# Patient Record
Sex: Female | Born: 1980 | Race: Black or African American | Hispanic: No | Marital: Single | Smoking: Former smoker
Health system: Southern US, Community
[De-identification: ages and names within clinical notes are randomized; demographics above are authoritative.]

## PROBLEM LIST (undated history)

## (undated) ENCOUNTER — Inpatient Hospital Stay (HOSPITAL_COMMUNITY): Payer: Self-pay

## (undated) DIAGNOSIS — F419 Anxiety disorder, unspecified: Secondary | ICD-10-CM

## (undated) DIAGNOSIS — F329 Major depressive disorder, single episode, unspecified: Secondary | ICD-10-CM

## (undated) DIAGNOSIS — G43909 Migraine, unspecified, not intractable, without status migrainosus: Secondary | ICD-10-CM

## (undated) DIAGNOSIS — F32A Depression, unspecified: Secondary | ICD-10-CM

## (undated) DIAGNOSIS — K219 Gastro-esophageal reflux disease without esophagitis: Secondary | ICD-10-CM

---

## 2005-12-07 ENCOUNTER — Emergency Department (HOSPITAL_COMMUNITY): Admission: EM | Admit: 2005-12-07 | Discharge: 2005-12-07 | Payer: Self-pay | Admitting: Family Medicine

## 2006-06-26 ENCOUNTER — Emergency Department (HOSPITAL_COMMUNITY): Admission: EM | Admit: 2006-06-26 | Discharge: 2006-06-26 | Payer: Self-pay | Admitting: Family Medicine

## 2006-07-05 ENCOUNTER — Other Ambulatory Visit: Admission: RE | Admit: 2006-07-05 | Discharge: 2006-07-05 | Payer: Self-pay | Admitting: Obstetrics and Gynecology

## 2006-10-05 ENCOUNTER — Ambulatory Visit (HOSPITAL_COMMUNITY): Admission: RE | Admit: 2006-10-05 | Discharge: 2006-10-05 | Payer: Self-pay | Admitting: Obstetrics and Gynecology

## 2007-02-06 ENCOUNTER — Inpatient Hospital Stay (HOSPITAL_COMMUNITY): Admission: AD | Admit: 2007-02-06 | Discharge: 2007-02-09 | Payer: Self-pay | Admitting: Obstetrics and Gynecology

## 2007-02-07 ENCOUNTER — Encounter (INDEPENDENT_AMBULATORY_CARE_PROVIDER_SITE_OTHER): Payer: Self-pay | Admitting: Obstetrics and Gynecology

## 2007-03-21 ENCOUNTER — Other Ambulatory Visit: Admission: RE | Admit: 2007-03-21 | Discharge: 2007-03-21 | Payer: Self-pay | Admitting: Obstetrics and Gynecology

## 2007-06-30 ENCOUNTER — Emergency Department (HOSPITAL_COMMUNITY): Admission: EM | Admit: 2007-06-30 | Discharge: 2007-06-30 | Payer: Self-pay | Admitting: Family Medicine

## 2008-06-02 ENCOUNTER — Emergency Department (HOSPITAL_COMMUNITY): Admission: EM | Admit: 2008-06-02 | Discharge: 2008-06-02 | Payer: Self-pay | Admitting: Emergency Medicine

## 2010-11-04 NOTE — Discharge Summary (Signed)
[name] [name], [name] [name] NO.:  [id number]   MEDICAL RECORD NO.:  [id number]          PATIENT TYPE:  INP   LOCATION:  9111                          FACILITY:  [facility]   PHYSICIAN:  Rudy Jew. Ashley Royalty, M.D.DATE OF BIRTH:  06/13/1981   DATE OF ADMISSION:  02/06/2007  DATE OF DISCHARGE:  02/09/2007                               DISCHARGE SUMMARY   DISCHARGE DIAGNOSES:  1. Intrauterine pregnancy at 36 weeks 5 days, delivered.  2. Premature rupture of membranes.  3. Preterm birth of living child, vertex.   OPERATIONS AND SPECIAL PROCEDURES:  Obstetrical delivery.   CONSULTATIONS:  None.   DISCHARGE MEDICATIONS:  1. Percocet.  2. Motrin 60 mg.   HISTORY AND PHYSICAL:  This is a 30 year old gravida 2, para 0, AB1, at  36 weeks 5 days' gestation at the time of admission.  Prenatal care was  complicated by sickle trait.  The patient also had a positive RPR, but  subsequent TPPA was nonreactive.  She presented to maternity admissions  complaining of vaginal burning.  Subsequent examination revealed rupture  of membranes; the duration was unknown.  Examination per R.N. revealed  positive pooling and burning.  The patient was felt to be a loose 1 cm,  75%, -2 station, vertex presentation.  For the remainder of the history  and physical, please see chart.   HOSPITAL COURSE:  The patient was admitted to [facility]  [place].  Admission laboratory studies were drawn.  On August 21 at 9  a.m., the patient was noted be 3+ cm dilated, 100% effaced, minus 2 to -  3 station, vertex presentation.  There was a forebag noted which was  successfully rupture revealing clear fluid.  An intrauterine pressure  catheter was placed.  The patient was given Pitocin as well as  prophylactic antibiotics due to the indeterminate length of ruptured  membranes.  She went on to labor and deliver on February 07, 2007.  The  infant was a 6-pound 6-ounce female, Apgars of 4 at 1 minute, 8 at 5  minutes, and sent to newborn nursery.  Delivery was accomplished from +3  station, OA, with Kiwi vacuum extractor secondary to decelerations  during the late second stage.  Pediatrics team attended.  Arterial cord  pH was 7.33.  Delivery was accomplished over an intact perineum.  There  was a second-degree midline laceration which was repaired without  difficulty.  The patient did well after delivery was discharged home,  afebrile and in satisfactory condition, on the second postpartum day.   DISPOSITION:  The patient is return to [facility] in 4-6 weeks for postpartum evaluation.      James A. Ashley Royalty, M.D.  Electronically Signed     JAM/MEDQ  D:  03/06/2007  T:  03/07/2007  Job:  [id number]

## 2011-02-27 ENCOUNTER — Inpatient Hospital Stay (INDEPENDENT_AMBULATORY_CARE_PROVIDER_SITE_OTHER)
Admission: RE | Admit: 2011-02-27 | Discharge: 2011-02-27 | Disposition: A | Discharge status: Home / Self Care | Payer: BC Managed Care – PPO | Source: Ambulatory Visit | Attending: Family Medicine | Admitting: Family Medicine

## 2011-02-27 DIAGNOSIS — N61 Mastitis without abscess: Secondary | ICD-10-CM

## 2011-02-27 DIAGNOSIS — L42 Pityriasis rosea: Secondary | ICD-10-CM

## 2011-03-31 LAB — RPR: RPR Ser Ql: NONREACTIVE

## 2011-03-31 LAB — CBC
HCT: 30.4 — ABNORMAL LOW
Hemoglobin: 10.3 — ABNORMAL LOW
MCHC: 33.8
MCHC: 34
MCV: 80.4
MCV: 80.4
RBC: 4.43
RDW: 14.5 — ABNORMAL HIGH

## 2011-03-31 LAB — URINALYSIS, ROUTINE W REFLEX MICROSCOPIC
Ketones, ur: NEGATIVE
Nitrite: NEGATIVE
Specific Gravity, Urine: 1.015
pH: 6

## 2011-03-31 LAB — TYPE AND SCREEN: ABO/RH(D): O POS

## 2012-03-19 ENCOUNTER — Encounter (HOSPITAL_COMMUNITY): Payer: Self-pay | Admitting: *Deleted

## 2012-03-19 ENCOUNTER — Emergency Department (INDEPENDENT_AMBULATORY_CARE_PROVIDER_SITE_OTHER): Payer: BC Managed Care – PPO

## 2012-03-19 ENCOUNTER — Emergency Department (INDEPENDENT_AMBULATORY_CARE_PROVIDER_SITE_OTHER)
Admission: EM | Admit: 2012-03-19 | Discharge: 2012-03-19 | Disposition: A | Discharge status: Home / Self Care | Payer: BC Managed Care – PPO | Source: Home / Self Care | Attending: Emergency Medicine | Admitting: Emergency Medicine

## 2012-03-19 DIAGNOSIS — L089 Local infection of the skin and subcutaneous tissue, unspecified: Secondary | ICD-10-CM

## 2012-03-19 DIAGNOSIS — B079 Viral wart, unspecified: Secondary | ICD-10-CM

## 2012-03-19 DIAGNOSIS — B078 Other viral warts: Secondary | ICD-10-CM

## 2012-03-19 MED ORDER — AMOXICILLIN-POT CLAVULANATE 500-125 MG PO TABS: 1.0000 | ORAL_TABLET | Freq: Three times a day (TID) | ORAL | Status: DC

## 2012-03-19 NOTE — ED Provider Notes (Signed)
History     CSN: [id number]  Arrival date & time 03/19/12  1005   First MD Initiated Contact with Patient 03/19/12 1048      Chief Complaint  Patient presents with  . Finger Injury    (Consider location/radiation/quality/duration/timing/severity/associated sxs/prior treatment) HPI Comments: Pt reports right thumb pain for the past 3 months. Pt has tried peroxide,alcholol, cutting, freezing, and all the over-the-counter products as she initially noticed a wart looking like lesion on the palmar surface of her right thumb about the middle of her thumb. Pain has gotten progressively worse in the thumb is tender at touch on the palmar surface of her right thumb. 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 She's even having some difficulties when she moves her thumb. Patient denies any numbness, tingling or weakness distally 2 thumb. Denies having observed any active drainage. Patient denies any direct injury or trauma to her thumb.  Patient is a 31 y.o. female presenting with hand pain. The history is provided by the patient.  Hand Pain This is a recurrent problem. The problem occurs constantly. Pertinent negatives include no abdominal pain. The symptoms are aggravated by bending. Nothing relieves the symptoms. Treatments tried: Alcohol, peroxide cutting, or freezing. The treatment provided no relief.    History reviewed. No pertinent past medical history.  History reviewed. No pertinent past surgical history.  Family History  Problem Relation Age of Onset  . Cancer Mother   . Diabetes Mother     History  Substance Use Topics  . Smoking status: Never Smoker   . Smokeless tobacco: Not on file  . Alcohol Use: No    OB History    Grav Para Term Preterm Abortions TAB SAB Ect Mult Living                  Review of Systems  Constitutional: Positive for activity change. Negative for fever and chills.  Gastrointestinal:  Negative for abdominal pain.  Skin: Positive for wound. Negative for color change.    Allergies  Review of patient's allergies indicates no known allergies.  Home Medications   Current Outpatient Rx  Name Route Sig Dispense Refill  . AMOXICILLIN-POT CLAVULANATE 500-125 MG PO TABS Oral Take 1 tablet (500 mg total) by mouth 3 (three) times daily. 21 tablet 0    BP 126/86  Pulse 80  Temp 98.4 F (36.9 C) (Oral)  Resp 18  SpO2 98%  LMP 03/06/2012  Physical Exam  Nursing note and vitals reviewed. Constitutional: Vital signs are normal. She appears well-developed and well-nourished.  Non-toxic appearance. She does not have a sickly appearance. She does not appear ill. No distress.  Musculoskeletal: She exhibits tenderness. She exhibits no edema.       Right wrist: She exhibits normal range of motion and no tenderness.       Hands: Neurological: She is alert.  Skin: No rash noted. There is erythema.    ED Course  Procedures (including critical care time)  Labs Reviewed - No data to display Dg Finger Thumb Right  03/19/2012  *RADIOLOGY REPORT*  Clinical Data: Finger injury  RIGHT THUMB 2+V  Comparison: None.  Findings: Four views of the right thumb submitted.  No acute fracture or subluxation.  IMPRESSION: No acute fracture or subluxation.   Original Report Authenticated By: Natasha Mead, M.D.      1. Infected finger   2. Infectious warts  MDM  Right thumb interdigital callus versus manipulated/partially removed wart. Patient has been started on a course of Augmentin. And instructed to followup with an orthopedic Dr. in 48-72 hours if no improvement. X-rays were unremarkable patient is able to move her finger. Patient was requesting job restrictions some restrictions were given. See work note,details      Jimmie Molly, MD 03/19/12 1704

## 2012-03-19 NOTE — ED Notes (Signed)
Pt reports right thumb pain for the past 3 months. Pt has tried peroxide,alcholol, cutting, freezing - no relief. Thumb is hot to touch, extremely tender to touch, unable to bend

## 2012-05-10 ENCOUNTER — Emergency Department (HOSPITAL_COMMUNITY): Payer: BC Managed Care – PPO

## 2012-05-10 ENCOUNTER — Emergency Department (HOSPITAL_COMMUNITY)
Admission: EM | Admit: 2012-05-10 | Discharge: 2012-05-10 | Disposition: A | Discharge status: Home / Self Care | Payer: BC Managed Care – PPO | Attending: Emergency Medicine | Admitting: Emergency Medicine

## 2012-05-10 ENCOUNTER — Encounter (HOSPITAL_COMMUNITY): Payer: Self-pay

## 2012-05-10 DIAGNOSIS — R112 Nausea with vomiting, unspecified: Secondary | ICD-10-CM | POA: Insufficient documentation

## 2012-05-10 DIAGNOSIS — R197 Diarrhea, unspecified: Secondary | ICD-10-CM | POA: Insufficient documentation

## 2012-05-10 DIAGNOSIS — Z331 Pregnant state, incidental: Secondary | ICD-10-CM | POA: Insufficient documentation

## 2012-05-10 DIAGNOSIS — R109 Unspecified abdominal pain: Secondary | ICD-10-CM

## 2012-05-10 DIAGNOSIS — Z3490 Encounter for supervision of normal pregnancy, unspecified, unspecified trimester: Secondary | ICD-10-CM

## 2012-05-10 DIAGNOSIS — R1084 Generalized abdominal pain: Secondary | ICD-10-CM | POA: Insufficient documentation

## 2012-05-10 DIAGNOSIS — R509 Fever, unspecified: Secondary | ICD-10-CM | POA: Insufficient documentation

## 2012-05-10 LAB — URINE MICROSCOPIC-ADD ON

## 2012-05-10 LAB — CBC WITH DIFFERENTIAL/PLATELET
Basophils Relative: 0 % (ref 0–1)
HCT: 37.5 % (ref 36.0–46.0)
Hemoglobin: 12.6 g/dL (ref 12.0–15.0)
Lymphocytes Relative: 43 % (ref 12–46)
Lymphs Abs: 1.9 10*3/uL (ref 0.7–4.0)
Monocytes Relative: 12 % (ref 3–12)
Neutro Abs: 2 10*3/uL (ref 1.7–7.7)
Neutrophils Relative %: 45 % (ref 43–77)
RBC: 4.87 MIL/uL (ref 3.87–5.11)
WBC: 4.4 10*3/uL (ref 4.0–10.5)

## 2012-05-10 LAB — HEPATIC FUNCTION PANEL
AST: 19 U/L (ref 0–37)
Albumin: 3.3 g/dL — ABNORMAL LOW (ref 3.5–5.2)
Alkaline Phosphatase: 45 U/L (ref 39–117)
Bilirubin, Direct: <0.1 mg/dL (ref 0.0–0.3)
Total Bilirubin: 0.3 mg/dL (ref 0.3–1.2)

## 2012-05-10 LAB — URINALYSIS, ROUTINE W REFLEX MICROSCOPIC
Glucose, UA: NEGATIVE mg/dL
Nitrite: NEGATIVE
Specific Gravity, Urine: 1.019 (ref 1.005–1.030)
pH: 6 (ref 5.0–8.0)

## 2012-05-10 LAB — ABO/RH: ABO/RH(D): O POS

## 2012-05-10 LAB — BASIC METABOLIC PANEL
BUN: 8 mg/dL (ref 6–23)
CO2: 26 mEq/L (ref 19–32)
Chloride: 102 mEq/L (ref 96–112)
GFR calc Af Amer: >90 mL/min (ref >90)
Potassium: 3.6 mEq/L (ref 3.5–5.1)

## 2012-05-10 LAB — OB RESULTS CONSOLE GC/CHLAMYDIA: Chlamydia: NEGATIVE

## 2012-05-10 MED ORDER — ACETAMINOPHEN 325 MG PO TABS
650.0000 mg | ORAL_TABLET | Freq: Once | ORAL | Status: AC
  Administered 2012-05-10: 650 mg via ORAL
  Filled 2012-05-10: qty 2

## 2012-05-10 MED ORDER — ONDANSETRON 8 MG PO TBDP: 8.0000 mg | ORAL_TABLET | Freq: Three times a day (TID) | ORAL | Status: DC | PRN

## 2012-05-10 MED ORDER — PRENATAL COMPLETE 14-0.4 MG PO TABS: 1.0000 | ORAL_TABLET | Freq: Every day | ORAL | Status: DC

## 2012-05-10 NOTE — ED Notes (Signed)
Patient transported to Ultrasound 

## 2012-05-10 NOTE — ED Provider Notes (Addendum)
History   This chart was scribed for [name] Kras, MD by Charolett Bumpers, ER Scribe. The patient was seen in room TR05C/TR05C. Patient's care was started at 1733.   CSN: [id number] Arrival date & time 05/10/12  1444 First MD Initiated Contact with Patient 05/10/2012 1733   Chief Complaint  Patient presents with  . Abdominal Pain    The history is provided by the patient. No language interpreter was used.   [name] [name] is a 31 y.o. female who presents to the Emergency Department complaining of constant, generalized, mobile abdominal pain for the past week. She describes the pain as crampy and moves around. She reports associated chills, subjective fever, nausea, vomiting and diarrhea. She reports taking Tylenol with some improvement. She states her LNMP was Oct 12-17 and does not take any birth control. She denies any h/o abdominal surgeries. She reports G:3 P:1 A:1   History reviewed. No pertinent past medical history.  History reviewed. No pertinent past surgical history.  Family History  Problem Relation Age of Onset  . Cancer Mother   . Diabetes Mother     History  Substance Use Topics  . Smoking status: Never Smoker   . Smokeless tobacco: Not on file  . Alcohol Use: No    OB History    Grav Para Term Preterm Abortions TAB SAB Ect Mult Living                  Review of Systems  Constitutional: Positive for fever and chills.  Respiratory: Negative for shortness of breath.   Gastrointestinal: Positive for nausea, vomiting, abdominal pain and diarrhea.  Neurological: Negative for weakness.  All other systems reviewed and are negative.    Allergies  Review of patient's allergies indicates no known allergies.  Home Medications  No current outpatient prescriptions on file.  BP 132/74  Pulse 85  Temp 98.9 F (37.2 C) (Oral)  Resp 20  Ht 5\' 7"  (1.702 m)  Wt 290 lb (131.543 kg)  BMI 45.42 kg/m2  SpO2 99%  LMP 04/04/2012  Physical Exam  Nursing note  and vitals reviewed. Constitutional: She appears well-developed and well-nourished. No distress.  HENT:  Head: Normocephalic and atraumatic.  Right Ear: External ear normal.  Left Ear: External ear normal.  Eyes: Conjunctivae normal are normal. Right eye exhibits no discharge. Left eye exhibits no discharge. No scleral icterus.  Neck: Neck supple. No tracheal deviation present.  Cardiovascular: Normal rate, regular rhythm and intact distal pulses.   Pulmonary/Chest: Effort normal and breath sounds normal. No stridor. No respiratory distress. She has no wheezes. She has no rales.  Abdominal: Soft. Bowel sounds are normal. She exhibits no distension. There is tenderness. There is no rebound and no guarding.       Tenderness to palpation over the epigastrium, bilateral lower quadrants and suprapubic regions with greatest tenderness in lower abdomen. No guarding. No peritoneal signs.   Genitourinary: Vagina normal. Uterus is enlarged and tender. Cervix exhibits no motion tenderness. Right adnexum displays no mass. Left adnexum displays no mass. No vaginal discharge found.  Musculoskeletal: She exhibits no edema and no tenderness.  Neurological: She is alert. She has normal strength. No sensory deficit. Cranial nerve deficit:  no gross defecits noted. She exhibits normal muscle tone. She displays no seizure activity. Coordination normal.  Skin: Skin is warm and dry. No rash noted.  Psychiatric: She has a normal mood and affect.    ED Course  Procedures (including critical care time)  DIAGNOSTIC STUDIES: Oxygen Saturation is 99% on room air, normal by my interpretation.    COORDINATION OF CARE:  17:40-Discussed planned course of treatment with the patient including Tylenol, pelvic exam, an ultrasound and additional labs, who is agreeable at this time.   17:45-Medication Orders: Acetaminophen (Tylenol) tablet 650 mg-once  19:00 Informed pt of imaging and lab results. Waiting on ABO/Rh result.  Preformed pelvic exam with chaperon present.   19:05-Reviewed pt's old records. From 01/20/2007, Blood type O+   Labs Reviewed  URINALYSIS, ROUTINE W REFLEX MICROSCOPIC - Abnormal; Notable for the following:    APPearance CLOUDY (*)     Leukocytes, UA SMALL (*)     All other components within normal limits  CBC WITH DIFFERENTIAL - Abnormal; Notable for the following:    MCV 77.0 (*)     MCH 25.9 (*)     All other components within normal limits  POCT PREGNANCY, URINE - Abnormal; Notable for the following:    Preg Test, Ur POSITIVE (*)     All other components within normal limits  URINE MICROSCOPIC-ADD ON - Abnormal; Notable for the following:    Squamous Epithelial / LPF FEW (*)     Bacteria, UA FEW (*)     All other components within normal limits  HCG, QUANTITATIVE, PREGNANCY - Abnormal; Notable for the following:    hCG, Beta Chain, Quant, S 14782 (*)     All other components within normal limits  HEPATIC FUNCTION PANEL - Abnormal; Notable for the following:    Albumin 3.3 (*)     All other components within normal limits  BASIC METABOLIC PANEL  LIPASE, BLOOD  ABO/RH   US Ob Comp Less 14 Wks  05/10/2012  *RADIOLOGY REPORT*  Clinical Data: Pregnant patient with pain.  OBSTETRIC <14 WK [place] AND TRANSVAGINAL OB US  Technique:  Both transabdominal and transvaginal ultrasound examinations were performed for complete evaluation of the gestation as well as the maternal uterus, adnexal regions, and pelvic cul-de-sac.  Transvaginal technique was performed to assess early pregnancy.  Comparison:  None.  Intrauterine gestational sac:  Visualized/normal in shape. Yolk sac: Visualized. Embryo: Visualized. Cardiac Activity: Detected. Heart Rate: 113 bpm  CRL: 25  mm  5 w  6 d         [place] EDC: 01/04/2013  Maternal uterus/adnexae: The right ovary is not visualized.  Two cystic structure with internal echoes are identified in the left ovary each measuring approximately 1.8 cm. No pelvic fluid is  identified.  IMPRESSION:  1.  Single living intrauterine pregnancy without complicating feature. 2.  Two cystic structures in the right ovary with internal echoes likely hemorrhagic cysts.  Recommend attention on follow-up studies.   Original Report Authenticated By: Holley Dexter, M.D.    US Ob Transvaginal  05/10/2012  *RADIOLOGY REPORT*  Clinical Data: Pregnant patient with pain.  OBSTETRIC <14 WK [place] AND TRANSVAGINAL OB US  Technique:  Both transabdominal and transvaginal ultrasound examinations were performed for complete evaluation of the gestation as well as the maternal uterus, adnexal regions, and pelvic cul-de-sac.  Transvaginal technique was performed to assess early pregnancy.  Comparison:  None.  Intrauterine gestational sac:  Visualized/normal in shape. Yolk sac: Visualized. Embryo: Visualized. Cardiac Activity: Detected. Heart Rate: 113 bpm  CRL: 25  mm  5 w  6 d         [place] EDC: 01/04/2013  Maternal uterus/adnexae: The right ovary is not visualized.  Two cystic structure with internal echoes are  identified in the left ovary each measuring approximately 1.8 cm. No pelvic fluid is identified.  IMPRESSION:  1.  Single living intrauterine pregnancy without complicating feature. 2.  Two cystic structures in the right ovary with internal echoes likely hemorrhagic cysts.  Recommend attention on follow-up studies.   Original Report Authenticated By: Holley Dexter, M.D.      1. Pregnancy   2. Abdominal pain       MDM  Pt has a single live IUP.  Hemorrhagic cysts noted on [place].  May be contributing to her pain.  Recommend follow up with OB GYN.  Will rx prenatal vitamins and antiemetic to be taken as needed   I personally performed the services described in this documentation, which was scribed in my presence. The recorded information has been reviewed and is accurate.      [name] Kras, MD 05/10/12 [phone]

## 2012-05-10 NOTE — ED Notes (Signed)
Pt presents with 1 week h/o abdominal pain.  Pt reports pain began to epigastric area, is now generalized.  Pt reports pain is constant, will lessen with tylenol.  Pt describes as a cramping.  +nausea and vomiting x 2 episodes and diarrhea.  Pt reports dark-colored urine and urinary frequency, denies any dysuria. Pt reports increased amount of vaginal discharge that is clear.

## 2012-05-11 LAB — GC/CHLAMYDIA PROBE AMP: CT Probe RNA: NEGATIVE

## 2012-08-29 LAB — OB RESULTS CONSOLE HEPATITIS B SURFACE ANTIGEN: Hepatitis B Surface Ag: NEGATIVE

## 2012-08-29 LAB — OB RESULTS CONSOLE RPR: RPR: NONREACTIVE

## 2012-08-29 LAB — OB RESULTS CONSOLE HGB/HCT, BLOOD: HCT: 36 %

## 2012-08-29 LAB — OB RESULTS CONSOLE HIV ANTIBODY (ROUTINE TESTING): HIV: NONREACTIVE

## 2012-09-03 ENCOUNTER — Ambulatory Visit (INDEPENDENT_AMBULATORY_CARE_PROVIDER_SITE_OTHER): Payer: BC Managed Care – PPO

## 2012-09-03 ENCOUNTER — Other Ambulatory Visit: Payer: Self-pay | Admitting: Obstetrics

## 2012-09-03 DIAGNOSIS — Z369 Encounter for antenatal screening, unspecified: Secondary | ICD-10-CM

## 2012-09-03 LAB — US OB DETAIL + 14 WK

## 2012-09-11 ENCOUNTER — Encounter: Payer: Self-pay | Admitting: Obstetrics

## 2012-09-13 ENCOUNTER — Telehealth: Payer: Self-pay | Admitting: *Deleted

## 2012-09-13 NOTE — Telephone Encounter (Signed)
Pt states is returning call to "Nurse Sue Lush" at number provided above. No pending phone notes in system. Will forward to A. Mariane Duval, LPN for review.

## 2012-09-18 ENCOUNTER — Encounter (HOSPITAL_COMMUNITY): Payer: BC Managed Care – PPO

## 2012-09-18 NOTE — Telephone Encounter (Signed)
Pt had test results in mysis. Results reviewed with patient and mfm referral made per Dr. Clearance Coots.

## 2012-09-19 ENCOUNTER — Ambulatory Visit (HOSPITAL_COMMUNITY)
Admission: RE | Admit: 2012-09-19 | Discharge: 2012-09-19 | Disposition: A | Discharge status: Home / Self Care | Payer: BC Managed Care – PPO | Source: Ambulatory Visit | Attending: Obstetrics | Admitting: Obstetrics

## 2012-09-19 DIAGNOSIS — D573 Sickle-cell trait: Secondary | ICD-10-CM | POA: Insufficient documentation

## 2012-09-19 DIAGNOSIS — Z810 Family history of intellectual disabilities: Secondary | ICD-10-CM | POA: Insufficient documentation

## 2012-09-19 NOTE — Progress Notes (Signed)
Genetic Counseling  High-Risk Gestation Note  Appointment Date:  09/19/2012 Referred By: Brock Bad, MD Date of Birth:  02-22-1981    Pregnancy History: [id number] Estimated Date of Delivery: 01/04/2013 Estimated Gestational Age: [redacted]w[redacted]d Attending: Particia Nearing, MD  Ms. [name] [name] was seen for consultation for genetic counseling given that the patient has sickle cell trait.   Hemoglobin electrophoresis performed for Ms. 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  Ms. [name] [name] reported that the couple's 51 year-old daughter also has sickle cell trait, which she reported was detected via newborn screening. The patient reported that her father also has sickle cell trait. The father of the pregnancy, Mr. Piedad Climes, reportedly does not have sickle cell trait and has no family history of sickle cell trait or sickle cell anemia. However, the patient is unsure if he has had screening via hemoglobin electrophoresis to confirm that he does not carry sickle cell trait or other hemoglobin variant.   We discussed that Sickle Cell anemia (SCA) is a hemoglobinopathy in which there is an inherited structural abnormality in one of the globin chains, specifically the beta globin chain.  It is characterized by episodes of vaso-occlusive crisis and chronic anemia due to the tendency of red blood cells to become deformed under conditions of decreased oxygen tension.  The basic defect in SCA involves the change of a single amino acid altering the configuration of the hemoglobin molecule causing the cells to sickle and to obstruct blood flow in small vessels.  Ischemia of tissues and organs results.  Increased cell fragility with increased phagocytosis and splenic sequestration of the fragile cells produces anemia.    SCA (Hgb SS) is inherited as an autosomal recessive condition.  The carrier state is Hgb AS.  We discussed that there is a 1 in 4  (25%) chance with each pregnancy to have a child with SCA when both parents are carriers for SCA (Hgb AS) or other beta globin chain variant.  For a carrier couple, there is also a 1 in 4 chance to have a child who is not a carrier, or affected with SCA (Hgb AA) and a 1 in 2 chance to have a child who is a carrier for SCA (Hgb AS).  We reviewed that early diagnosis of SCA is facilitated by newborn screening before the onset of symptoms.  Clinical manifestations usually present in the first or second year of life.  We discussed the option of amniocentesis as a way to determine, prenatally, if a baby has SCA or not.  A risk of 1 in 300-500 was given for amniocentesis, the primary complication being spontaneous pregnancy loss, or at this late gestation, preterm delivery.  She indicated that she understood these risks but would not be interested in amniocentesis, even in the event that Mr. Ala Dach has a hemoglobin variant. She understands that although the ultrasound may appear normal, the risk of anomalies cannot be completely eliminated, and that a baby with SCA would not appear different on a prenatal ultrasound.   Given the recessive inheritance, we reviewed the importance of understanding Mr. Marily Lente carrier status in order to accurately predict the risk of a hemoglobinopathy in the fetus. We discussed that other hemoglobin variants (hemoglobin C), when combined with hemoglobin S, can cause a medically significant hemoglobinopathy. Testing via hemoglobin electrophoresis is available to Mr. Ford to determine whether he has any hemoglobin variant, including SCT. If he has not had  previous screening, he would have the general population risk of approximately 1 in 12 to have sickle cell trait, given no known family history of sickle cell trait for Mr. Ford and no known consanguinity for the couple. She understands that an accurate risk assessment cannot be performed without further information. However, assuming that Mr.  Ford does not have SCT or any other hemoglobin variant, the fetus would not be expected to have an increased risk for a hemoglobinopathy but would have a 1 in 2 (50%) chance to inherit sickle cell trait.    Both family histories were reviewed and found to be additionally contributory for mental retardation for the patient's maternal great-aunt (maternal grandfather's sister) and that relative's son.  Both individuals are deceased and an underlying etiology is not known for their mental delays. Ms. [name] [name] reported that the great-aunt possibly had facial features that were different from other relatives.  We discussed that there are many different causes of mental retardation such as genetic differences, sporadic causes, or injuries.  A specific diagnosis for mental retardation can be determined in approximately 50% of cases.  In the remaining 50% of cases, a diagnosis may not ever be determined.  Without more specific information, potential genetic risks cannot be determined.   The patient also reported that her maternal uncle and her maternal grandmother had a history of epilepsy. The etiology was not known. Epilepsy occurs in approximately 1% of the population and can have many causes.  Approximately 80% of epilepsy is thought to be idiopathic while the remaining 20% is secondary to a variety of factors such as perinatal events, infections, trauma and genetic disease.  A specific diagnosis in an affected individual is necessary to accurately assess the risk for other family members to develop epilepsy.  In the absence of a known etiology, epilepsy is thought to be caused by a combination of genetic and environmental factors, called multifactorial inheritance. Recurrence risk for epilepsy is estimated to be 4% for offspring of an individual with primary idiopathic epilepsy. Given the degree of relation of the reported individuals (third degree relatives to the pregnancy), recurrence risk would be expected to be  less than 4%. It would be helpful for Ms. [name] [name] to inform her pediatrician of this history so that her children can be screened and followed appropriately.      Additionally, Ms. [name] [name] reported multiple individuals in the family with breast cancer. Her mother was diagnosed with breast cancer at age 35 years old. The patient reported that her maternal grandmother was diagnosed with breast cancer and passed away in her 63's, and the patient's maternal great-aunt (her grandmother's sister) and maternal great-grandmother (her maternal grandmother's mother) also had breast cancer, ages of diagnosis unknown, and are both deceased. Though most cancers are thought to be sporadic or due to environmental factors, some families appear to have a strong predisposition to cancers.  When considering a family history of cancer, we look for common types of cancer in multiple family members occurring at younger than typical ages. we discussed the option of meeting with a cancer genetic counselor to discuss any possible screening or testing options available. If she and/or her mother are concerned about the family history of cancer and would like to learn more about the family's chance for an inherited cancer syndrome, her doctor may refer her to [facility] [phone]). Ms. [name] [name] reported that she has an upcoming appointment for screening given this reported family history.   Ms. [name] [name] was  not familiar with the father of the baby's family history.  We, therefore, cannot comment on how his history might contribute to the overall chance for the baby to have a birth defect. Without further information regarding the provided family history, an accurate genetic risk cannot be calculated. Further genetic counseling is warranted if more information is obtained.  Ms. [name] [name] denied exposure to environmental toxins or chemical agents. She denied the use of alcohol, tobacco or street drugs. She denied  significant viral illnesses during the course of her pregnancy. Her medical and surgical histories were noncontributory.   I counseled Ms. [name] [name] for approximately 40 minutes regarding the above risks and available options.    Quinn Plowman, MS,  Certified Genetic Counselor 09/19/2012

## 2012-09-20 ENCOUNTER — Encounter: Payer: Self-pay | Admitting: *Deleted

## 2012-09-20 ENCOUNTER — Other Ambulatory Visit: Payer: Self-pay

## 2012-09-25 ENCOUNTER — Telehealth: Payer: Self-pay | Admitting: *Deleted

## 2012-09-25 ENCOUNTER — Inpatient Hospital Stay (HOSPITAL_COMMUNITY)
Admission: AD | Admit: 2012-09-25 | Discharge: 2012-09-25 | Disposition: A | Discharge status: Home / Self Care | Payer: BC Managed Care – PPO | Source: Ambulatory Visit | Attending: Obstetrics & Gynecology | Admitting: Obstetrics & Gynecology

## 2012-09-25 ENCOUNTER — Encounter (HOSPITAL_COMMUNITY): Payer: Self-pay

## 2012-09-25 DIAGNOSIS — O212 Late vomiting of pregnancy: Secondary | ICD-10-CM | POA: Insufficient documentation

## 2012-09-25 DIAGNOSIS — K529 Noninfective gastroenteritis and colitis, unspecified: Secondary | ICD-10-CM

## 2012-09-25 DIAGNOSIS — O99891 Other specified diseases and conditions complicating pregnancy: Secondary | ICD-10-CM | POA: Insufficient documentation

## 2012-09-25 DIAGNOSIS — K5289 Other specified noninfective gastroenteritis and colitis: Secondary | ICD-10-CM

## 2012-09-25 DIAGNOSIS — R109 Unspecified abdominal pain: Secondary | ICD-10-CM | POA: Insufficient documentation

## 2012-09-25 DIAGNOSIS — A088 Other specified intestinal infections: Secondary | ICD-10-CM | POA: Insufficient documentation

## 2012-09-25 DIAGNOSIS — R197 Diarrhea, unspecified: Secondary | ICD-10-CM | POA: Insufficient documentation

## 2012-09-25 HISTORY — DX: Anxiety disorder, unspecified: F41.9

## 2012-09-25 HISTORY — DX: Major depressive disorder, single episode, unspecified: F32.9

## 2012-09-25 HISTORY — DX: Depression, unspecified: F32.A

## 2012-09-25 HISTORY — DX: Migraine, unspecified, not intractable, without status migrainosus: G43.909

## 2012-09-25 LAB — CBC
HCT: 35 % — ABNORMAL LOW (ref 36.0–46.0)
MCH: 26.2 pg (ref 26.0–34.0)
MCHC: 34.3 g/dL (ref 30.0–36.0)
MCV: 76.4 fL — ABNORMAL LOW (ref 78.0–100.0)
RDW: 14.4 % (ref 11.5–15.5)

## 2012-09-25 LAB — COMPREHENSIVE METABOLIC PANEL
ALT: 11 U/L (ref 0–35)
Albumin: 2.8 g/dL — ABNORMAL LOW (ref 3.5–5.2)
Alkaline Phosphatase: 56 U/L (ref 39–117)
Chloride: 102 mEq/L (ref 96–112)
Glucose, Bld: 81 mg/dL (ref 70–99)
Potassium: 3.5 mEq/L (ref 3.5–5.1)
Sodium: 137 mEq/L (ref 135–145)
Total Protein: 7.1 g/dL (ref 6.0–8.3)

## 2012-09-25 LAB — URINALYSIS, ROUTINE W REFLEX MICROSCOPIC
Hgb urine dipstick: NEGATIVE
Nitrite: NEGATIVE
Specific Gravity, Urine: >1.03 — ABNORMAL HIGH (ref 1.005–1.030)
Urobilinogen, UA: 1 mg/dL (ref 0.0–1.0)
pH: 6 (ref 5.0–8.0)

## 2012-09-25 MED ORDER — ONDANSETRON HCL 4 MG PO TABS: 4.0000 mg | ORAL_TABLET | Freq: Four times a day (QID) | ORAL | Status: DC

## 2012-09-25 MED ORDER — ONDANSETRON HCL 4 MG/2ML IJ SOLN
4.0000 mg | Freq: Once | INTRAMUSCULAR | Status: AC
  Administered 2012-09-25: 4 mg via INTRAVENOUS
  Filled 2012-09-25: qty 2

## 2012-09-25 MED ORDER — LACTATED RINGERS IV BOLUS (SEPSIS)
1000.0000 mL | Freq: Once | INTRAVENOUS | Status: AC
  Administered 2012-09-25: 1000 mL via INTRAVENOUS

## 2012-09-25 MED ORDER — PROMETHAZINE HCL 12.5 MG PO TABS: 12.5000 mg | ORAL_TABLET | Freq: Four times a day (QID) | ORAL | Status: DC | PRN

## 2012-09-25 NOTE — MAU Provider Note (Signed)
History     CSN: [id number]  Arrival date and time: 09/25/12 1152   First Provider Initiated Contact with Patient 09/25/12 1241      Chief Complaint  Patient presents with  . Emesis  . Diarrhea  . Abdominal Pain   HPI Ms. [name] [name] is a 32 y.o. [id number] at [redacted]w[redacted]d who presents to MAU today with N/V since Friday. The patient states that she has not been able to keep anything down x 24 hours. She has been trying to hydrate with water and gatorade without success. She is also having diarrhea. She denies contractions, vaginal bleeding, abnormal discharge or fever. She is having some cramping with vomiting only. She denies other abdominal pain today. She reports good fetal movement.   OB History   Grav Para Term Preterm Abortions TAB SAB Ect Mult Living   4 1 1  2 1 1   1       Past Medical History  Diagnosis Date  . Depression   . Anxiety   . Migraine     History reviewed. No pertinent past surgical history.  Family History  Problem Relation Age of Onset  . Cancer Mother   . Diabetes Mother     History  Substance Use Topics  . Smoking status: Never Smoker   . Smokeless tobacco: Not on file  . Alcohol Use: No    Allergies: No Known Allergies  Prescriptions prior to admission  Medication Sig Dispense Refill  . cholecalciferol (VITAMIN D) 1000 UNITS tablet Take 1,000 Units by mouth every other day.      Marland Kitchen omeprazole (PRILOSEC) 20 MG capsule Take 20 mg by mouth daily.      . Prenatal Vit-Min-FA-Fish Oil ([employer] PRENATAL GUMMY PO) Take 1 each by mouth daily.        Review of Systems  Constitutional: Negative for fever and malaise/fatigue.  Gastrointestinal: Positive for nausea, vomiting, abdominal pain and diarrhea. Negative for constipation.  Genitourinary: Negative for dysuria, urgency and frequency.       Neg - vaginal bleeding, discharge  Neurological: Positive for weakness. Negative for loss of consciousness.   Physical Exam   Blood pressure 121/65, pulse 86,  temperature 99.6 F (37.6 C), temperature source Oral, resp. rate 18, height 5' 6.5" (1.689 m), weight 288 lb 9.6 oz (130.908 kg), last menstrual period 03/30/2012, SpO2 100.00%.  Physical Exam  Constitutional: She is oriented to person, place, and time. She appears well-developed and well-nourished. No distress.  HENT:  Head: Normocephalic and atraumatic.  Cardiovascular: Normal rate, regular rhythm and normal heart sounds.   Respiratory: Effort normal and breath sounds normal. No respiratory distress.  GI: Soft. Bowel sounds are normal. She exhibits no distension and no mass. There is tenderness (epigastric tenderness to palpation). There is no rebound and no guarding.  Neurological: She is alert and oriented to person, place, and time.  Skin: Skin is warm and dry. No erythema.  Psychiatric: She has a normal mood and affect.   Results for orders placed during the hospital encounter of 09/25/12 (from the past 24 hour(s))  URINALYSIS, ROUTINE W REFLEX MICROSCOPIC     Status: Abnormal   Collection Time    09/25/12 12:07 PM      Result Value Range   Color, Urine YELLOW  YELLOW   APPearance HAZY (*) CLEAR   Specific Gravity, Urine >1.030 (*) 1.005 - 1.030   pH 6.0  5.0 - 8.0   Glucose, UA NEGATIVE  NEGATIVE mg/dL   Hgb  urine dipstick NEGATIVE  NEGATIVE   Bilirubin Urine NEGATIVE  NEGATIVE   Ketones, ur 40 (*) NEGATIVE mg/dL   Protein, ur NEGATIVE  NEGATIVE mg/dL   Urobilinogen, UA 1.0  0.0 - 1.0 mg/dL   Nitrite NEGATIVE  NEGATIVE   Leukocytes, UA NEGATIVE  NEGATIVE  COMPREHENSIVE METABOLIC PANEL     Status: Abnormal   Collection Time    09/25/12  1:00 PM      Result Value Range   Sodium 137  135 - 145 mEq/L   Potassium 3.5  3.5 - 5.1 mEq/L   Chloride 102  96 - 112 mEq/L   CO2 23  19 - 32 mEq/L   Glucose, Bld 81  70 - 99 mg/dL   BUN 4 (*) 6 - 23 mg/dL   Creatinine, Ser 4.54  0.50 - 1.10 mg/dL   Calcium 9.0  8.4 - 09.8 mg/dL   Total Protein 7.1  6.0 - 8.3 g/dL   Albumin 2.8 (*)  3.5 - 5.2 g/dL   AST 18  0 - 37 U/L   ALT 11  0 - 35 U/L   Alkaline Phosphatase 56  39 - 117 U/L   Total Bilirubin 0.3  0.3 - 1.2 mg/dL   GFR calc non Af Amer >90  >90 mL/min   GFR calc Af Amer >90  >90 mL/min  CBC     Status: Abnormal   Collection Time    09/25/12  1:00 PM      Result Value Range   WBC 5.2  4.0 - 10.5 K/uL   RBC 4.58  3.87 - 5.11 MIL/uL   Hemoglobin 12.0  12.0 - 15.0 g/dL   HCT 11.9 (*) 14.7 - 82.9 %   MCV 76.4 (*) 78.0 - 100.0 fL   MCH 26.2  26.0 - 34.0 pg   MCHC 34.3  30.0 - 36.0 g/dL   RDW 56.2  13.0 - 86.5 %   Platelets 224  150 - 400 K/uL    MAU Course  Procedures None  MDM Dehydration evident on UA. 1L LR IV with Zofran in MAU today Within 1 hour of Zofran administration patient reports improvement in nausea although she did have one additional episode of vomiting approximately 15 minutes after Zofran Additional 4 mg Zofran IV given - patient reports significant improvement. No addition episodes of emesis at this time.   Assessment and Plan  A: Gastroenteritis, viral  P: Discharge home Rx for phenergan and Zofran sent to patient's pharmacy Patient encouraged to keep scheduled follow-up with Dr. Tamela Oddi Patient may return to MAU as needed or if her condition were to change or worsen  Freddi Starr, PA-C  09/25/2012, 6:04 PM

## 2012-09-25 NOTE — MAU Note (Signed)
Patient states she has had abdominal pain, vomiting and diarrhea for over 24 hours. Not able to keep anything down. Denies bleeding or leaking. Urinates with coughing. Reports fetal movement.

## 2012-09-25 NOTE — Telephone Encounter (Signed)
Pt reports she has been sick since Friday- and can't keep solids or fluids down. Pt thinks she may have a virus, but has not been seen for that. Her concern is that she has a glucose test scheduled tomorrow. Told patient the main concern is her n/v at this time and risk for dehydration. Advised patient to go to MAU for fluids and an anti medic medication. Will call to check on her tomorrow and reschedule her appointment.

## 2012-09-26 ENCOUNTER — Other Ambulatory Visit: Payer: BC Managed Care – PPO

## 2012-09-26 ENCOUNTER — Ambulatory Visit (INDEPENDENT_AMBULATORY_CARE_PROVIDER_SITE_OTHER): Payer: BC Managed Care – PPO | Admitting: Obstetrics

## 2012-09-26 ENCOUNTER — Encounter: Payer: Self-pay | Admitting: Obstetrics

## 2012-09-26 VITALS — BP 117/75 | Temp 98.2°F | Wt 289.0 lb

## 2012-09-26 DIAGNOSIS — Z3482 Encounter for supervision of other normal pregnancy, second trimester: Secondary | ICD-10-CM

## 2012-09-26 DIAGNOSIS — Z3480 Encounter for supervision of other normal pregnancy, unspecified trimester: Secondary | ICD-10-CM

## 2012-09-26 DIAGNOSIS — E669 Obesity, unspecified: Secondary | ICD-10-CM | POA: Insufficient documentation

## 2012-09-26 DIAGNOSIS — D573 Sickle-cell trait: Secondary | ICD-10-CM

## 2012-09-26 LAB — CBC
HCT: 36.3 % (ref 36.0–46.0)
Hemoglobin: 11.8 g/dL — ABNORMAL LOW (ref 12.0–15.0)
MCH: 25.2 pg — ABNORMAL LOW (ref 26.0–34.0)
MCV: 77.6 fL — ABNORMAL LOW (ref 78.0–100.0)
RBC: 4.68 MIL/uL (ref 3.87–5.11)

## 2012-09-26 LAB — POCT URINALYSIS DIPSTICK
Blood, UA: NEGATIVE
Spec Grav, UA: 1.02
Urobilinogen, UA: NEGATIVE
pH, UA: 5

## 2012-09-26 NOTE — Progress Notes (Signed)
Doing well 

## 2012-09-26 NOTE — Progress Notes (Signed)
P 73 Pt ws seen at [facility] yesterday- fluids, anti medic, labs - pt is feeling better today.

## 2012-09-26 NOTE — Patient Instructions (Signed)

## 2012-09-27 LAB — HIV ANTIBODY (ROUTINE TESTING W REFLEX): HIV: NONREACTIVE

## 2012-09-27 LAB — GLUCOSE TOLERANCE, 2 HOURS W/ 1HR: Glucose, 1 hour: 101 mg/dL (ref 70–170)

## 2012-10-09 ENCOUNTER — Encounter: Payer: BC Managed Care – PPO | Admitting: Obstetrics & Gynecology

## 2012-10-14 ENCOUNTER — Ambulatory Visit (INDEPENDENT_AMBULATORY_CARE_PROVIDER_SITE_OTHER): Payer: BC Managed Care – PPO | Admitting: Obstetrics & Gynecology

## 2012-10-14 VITALS — BP 121/84 | Temp 98.8°F | Wt 290.0 lb

## 2012-10-14 DIAGNOSIS — N390 Urinary tract infection, site not specified: Secondary | ICD-10-CM

## 2012-10-14 DIAGNOSIS — Z3483 Encounter for supervision of other normal pregnancy, third trimester: Secondary | ICD-10-CM

## 2012-10-14 DIAGNOSIS — Z3482 Encounter for supervision of other normal pregnancy, second trimester: Secondary | ICD-10-CM

## 2012-10-14 DIAGNOSIS — Z3480 Encounter for supervision of other normal pregnancy, unspecified trimester: Secondary | ICD-10-CM | POA: Insufficient documentation

## 2012-10-14 LAB — POCT URINALYSIS DIPSTICK
Spec Grav, UA: 1.02
Urobilinogen, UA: NEGATIVE

## 2012-10-14 MED ORDER — SULFAMETHOXAZOLE-TMP DS 800-160 MG PO TABS: 1.0000 | ORAL_TABLET | Freq: Two times a day (BID) | ORAL | Status: DC

## 2012-10-14 NOTE — Progress Notes (Signed)
Pulse- 84 Pt states her truck broke down in the middle of the road on Friday. Pt states she had to push her truck out of the road by herself. Pt states she has had good fetal movement but has been experiencing pain in her lower abdomen. Pt states she is having to urinate quickly after the baby moves. Pt states she is going 3 times within a 15 minute span. Pt states this has only started after Friday.

## 2012-10-14 NOTE — Patient Instructions (Signed)
Urinary Tract Infection in Pregnancy  A urinary tract infection (UTI) is a bacterial infection of the urinary tract. Infection of the urinary tract can include the ureters, kidneys (pyelonephritis), bladder (cystitis), and urethra (urethritis). All pregnant women should be screened for bacteria in the urinary tract. Identifying and treating a UTI will decrease the risk of preterm labor and developing more serious infections in both the mother and baby.  CAUSES  Bacteria germs cause almost all UTIs. There are many factors that can increase your chances of getting a UTI during pregnancy. These include:   Having a short urethra.   Poor toilet and hygiene habits.   Sexual intercourse.   Blockage of urine along the urinary tract.   Problems with the pelvic muscles or nerves.   Diabetes.   Obesity.   Bladder problems after having several children.   Previous history of UTI.  SYMPTOMS    Pain, burning, or a stinging feeling when urinating.   Suddenly feeling the need to urinate right away (urgency).   Loss of bladder control (urinary incontinence).   Frequent urination, more than is common with pregnancy.   Lower abdominal or back discomfort.   Bad smelling urine.   Cloudy urine.   Blood in the urine (hematuria).   Fever.  When the kidneys are infected, the symptoms may be:   Back pain.   Flank pain on the right side more so than the left.   Fever.   Chills.   Nausea.   Vomiting.  DIAGNOSIS    Urine tests.   Additional tests and procedures may include:   Ultrasound of the kidneys, ureters, bladder, and urethra.   Looking in the bladder with a lighted tube (cystoscopy).   Certain X-ray studies only when absolutely necessary.  Finding out the results of your test  Ask when your test results will be ready. Make sure you get your test results.  TREATMENT   Antibiotic medicine by mouth.   Antibiotics given through the vein (intravenously), if needed.  HOME CARE INSTRUCTIONS    Take your  antibiotics as directed. Finish them even if you start to feel better. Only take medicine as directed by your caregiver.   Drink enough fluids to keep your urine clear or pale yellow.   Do not have sexual intercourse until the infection is gone and your caregiver says it is okay.   Make sure you are tested for UTIs throughout your pregnancy if you get one. These infections often come back.  Preventing a UTI in the future:   Practice good toilet habits. Always wipe from front to back. Use the tissue only once.   Do not hold your urine. Empty your bladder as soon as possible when the urge comes.   Do not douche or use deodorant sprays.   Wash with soap and warm water around the genital area and the anus.   Empty your bladder before and after sexual intercourse.   Wear underwear with a cotton crotch.   Avoid caffeine and carbonated drinks. They can irritate the bladder.   Drink cranberry juice or take cranberry pills. This may decrease the risk of getting a UTI.   Do not drink alcohol.   Keep all your appointments and tests as scheduled.  SEEK MEDICAL CARE IF:    Your symptoms get worse.   You are still having fevers 2 or more days after treatment begins.   You develop a rash.   You feel that you are having problems with   medicines prescribed.   You develop abnormal vaginal discharge.  SEEK IMMEDIATE MEDICAL CARE IF:    You develop back or flank pain.   You develop chills.   You have blood in your urine.   You develop nausea and vomiting.   You develop contractions of your uterus.   You have a gush of fluid from the vagina.  MAKE SURE YOU:    Understand these instructions.   Will watch your condition.   Will get help right away if you are not doing well or get worse.  Document Released: 09/30/2010 Document Revised: 08/28/2011 Document Reviewed: 09/30/2010  [employer] Patient Information 2013 [employer].

## 2012-10-14 NOTE — Progress Notes (Signed)
Possible UTI.  Will treat empirically.

## 2012-10-15 LAB — URINALYSIS
Glucose, UA: NEGATIVE mg/dL
Protein, ur: NEGATIVE mg/dL
Urobilinogen, UA: 1 mg/dL (ref 0.0–1.0)

## 2012-10-16 ENCOUNTER — Other Ambulatory Visit: Payer: Self-pay | Admitting: Obstetrics & Gynecology

## 2012-10-16 DIAGNOSIS — O365931 Maternal care for other known or suspected poor fetal growth, third trimester, fetus 1: Secondary | ICD-10-CM

## 2012-10-16 LAB — URINE CULTURE: Colony Count: NO GROWTH

## 2012-10-29 ENCOUNTER — Other Ambulatory Visit: Payer: BC Managed Care – PPO

## 2012-10-30 ENCOUNTER — Encounter: Payer: BC Managed Care – PPO | Admitting: Obstetrics & Gynecology

## 2012-10-31 ENCOUNTER — Ambulatory Visit (INDEPENDENT_AMBULATORY_CARE_PROVIDER_SITE_OTHER): Payer: BC Managed Care – PPO | Admitting: Obstetrics & Gynecology

## 2012-10-31 ENCOUNTER — Encounter: Payer: Self-pay | Admitting: Obstetrics & Gynecology

## 2012-10-31 VITALS — BP 125/81 | Temp 98.6°F | Wt 285.0 lb

## 2012-10-31 DIAGNOSIS — Z3483 Encounter for supervision of other normal pregnancy, third trimester: Secondary | ICD-10-CM

## 2012-10-31 DIAGNOSIS — Z3480 Encounter for supervision of other normal pregnancy, unspecified trimester: Secondary | ICD-10-CM

## 2012-10-31 DIAGNOSIS — O365990 Maternal care for other known or suspected poor fetal growth, unspecified trimester, not applicable or unspecified: Secondary | ICD-10-CM

## 2012-10-31 DIAGNOSIS — O365931 Maternal care for other known or suspected poor fetal growth, third trimester, fetus 1: Secondary | ICD-10-CM

## 2012-10-31 LAB — POCT URINALYSIS DIPSTICK
Blood, UA: NEGATIVE
Spec Grav, UA: 1.015
Urobilinogen, UA: NEGATIVE
pH, UA: 5.5

## 2012-10-31 NOTE — Progress Notes (Signed)
Pulse-86 Pt states states she is now vomiting again twice a day.

## 2012-10-31 NOTE — Progress Notes (Signed)
Doing well 

## 2012-10-31 NOTE — Patient Instructions (Signed)

## 2012-11-12 ENCOUNTER — Encounter: Payer: Self-pay | Admitting: Obstetrics & Gynecology

## 2012-11-12 ENCOUNTER — Ambulatory Visit (INDEPENDENT_AMBULATORY_CARE_PROVIDER_SITE_OTHER): Payer: BC Managed Care – PPO | Admitting: Obstetrics

## 2012-11-12 ENCOUNTER — Ambulatory Visit (INDEPENDENT_AMBULATORY_CARE_PROVIDER_SITE_OTHER): Payer: BC Managed Care – PPO

## 2012-11-12 VITALS — BP 109/74 | Temp 98.7°F | Wt 290.0 lb

## 2012-11-12 DIAGNOSIS — O365990 Maternal care for other known or suspected poor fetal growth, unspecified trimester, not applicable or unspecified: Secondary | ICD-10-CM

## 2012-11-12 DIAGNOSIS — Z3480 Encounter for supervision of other normal pregnancy, unspecified trimester: Secondary | ICD-10-CM

## 2012-11-12 LAB — POCT URINALYSIS DIPSTICK
Blood, UA: NEGATIVE
Ketones, UA: NEGATIVE
Protein, UA: NEGATIVE
Spec Grav, UA: 1.015
pH, UA: 5

## 2012-11-12 LAB — US OB DETAIL + 14 WK

## 2012-11-12 NOTE — Progress Notes (Signed)
P 85 Patient reports some pressure now.

## 2012-11-13 ENCOUNTER — Encounter: Payer: Self-pay | Admitting: Obstetrics

## 2012-11-13 LAB — US OB DETAIL + 14 WK

## 2012-11-19 ENCOUNTER — Encounter: Payer: Self-pay | Admitting: Obstetrics & Gynecology

## 2012-11-25 ENCOUNTER — Encounter: Payer: BC Managed Care – PPO | Admitting: Obstetrics & Gynecology

## 2012-12-05 ENCOUNTER — Ambulatory Visit (INDEPENDENT_AMBULATORY_CARE_PROVIDER_SITE_OTHER): Payer: Medicaid Other | Admitting: Obstetrics & Gynecology

## 2012-12-05 ENCOUNTER — Encounter: Payer: Self-pay | Admitting: Obstetrics & Gynecology

## 2012-12-05 VITALS — BP 126/81 | Temp 98.3°F | Wt 288.4 lb

## 2012-12-05 DIAGNOSIS — Z3483 Encounter for supervision of other normal pregnancy, third trimester: Secondary | ICD-10-CM

## 2012-12-05 DIAGNOSIS — Z3480 Encounter for supervision of other normal pregnancy, unspecified trimester: Secondary | ICD-10-CM

## 2012-12-05 LAB — POCT URINALYSIS DIPSTICK
Blood, UA: NEGATIVE
Ketones, UA: NEGATIVE
Protein, UA: NEGATIVE
pH, UA: >=9

## 2012-12-05 NOTE — Progress Notes (Signed)
Pulse- 84.  Patient c/o abnormal discharge.  Denies any itching, burning or odor.

## 2012-12-05 NOTE — Patient Instructions (Addendum)
Patient information: Group B streptococcus and pregnancy (Beyond the Basics)  Authors Karen M Puopolo, MD, PhD Carol J Baker, MD Section Editors Charles J Lockwood, MD Daniel J Sexton, MD Deputy Editor Vanessa A Barss, MD Disclosures  All topics are updated as new evidence becomes available and our peer review process is complete.  Literature review current through: Feb 2014.  This topic last updated: Dec 18, 2011.  INTRODUCTION - Group B streptococcus (GBS) is a bacterium that can cause serious infections in pregnant women and newborn babies. GBS is one of many types of streptococcal bacteria, sometimes called "strep." This article discusses GBS, its effect on pregnant women and infants, and ways to prevent complications of GBS. More detailed information about GBS is available by subscription. (See "Group B streptococcal infection in pregnant women".) WHAT IS GROUP B STREP INFECTION? - GBS is commonly found in the digestive system and the vagina. In healthy adults, GBS is not harmful and does not cause problems. But in pregnant women and newborn infants, being infected with GBS can cause serious illness. Approximately one in three to four pregnant women in the [place] carries GBS in their gastrointestinal system and/or in their vagina. Carrying GBS is not the same as being infected. Carriers are not sick and do not need treatment during pregnancy. There is no treatment that can stop you from carrying GBS.  Pregnant women who are carriers of GBS infrequently become infected with GBS. GBS can cause urinary tract infections, infection of the amniotic fluid (bag of water), and infection of the uterus after delivery. GBS infections during pregnancy may lead to preterm labor.  Pregnant women who carry GBS can pass on the bacteria to their newborns, and some of those babies become infected with GBS. Newborns who are infected with GBS can develop pneumonia (lung infection), septicemia (blood infection), or  meningitis (infection of the lining of the brain and spinal cord). These complications can be prevented by giving intravenous antibiotics during labor to any woman who is at risk of GBS infection. You are at risk of GBS infection if: You have a urine culture during your current pregnancy showing GBS  You have a vaginal and rectal culture during your current pregnancy showing GBS  You had an infant infected with GBS in the past GROUP B STREP PREVENTION - Most doctors and nurses recommend a urine culture early in your pregnancy to be sure that you do not have a bladder infection without symptoms. If you urine culture shows GBS or other bacteria, you may be treated with an antibiotic. If you have symptoms of urinary infection, such as pain with urination, any time during your pregnancy, a urine culture is done. If GBS grows from the urine culture, it should be treated with an antibiotic, and you should also receive intravenous antibiotics during labor. Expert groups recommend that all pregnant women have a GBS culture at 35 to 37 weeks of pregnancy. The culture is done by swabbing the vagina and rectum. If your GBS culture is positive, you will be given an intravenous antibiotic during labor. If you have preterm labor, the culture is done then and an intravenous antibiotic is given until the baby is born or the labor is stopped by your health care provider. If you have a positive GBS culture and you have an allergy to penicillin, be sure your doctor and nurse are aware of this allergy and tell them what happened with the allergy. If you had only a rash or itching, this   is not a serious allergy, and you can receive a common drug related to the penicillin. If you had a serious allergy (for example, trouble breathing, swelling of your face) you may need an additional test to determine which antibiotic should be used during labor. Being treated with an antibiotic during labor greatly reduces the chance that you or  your newborn will develop infections related to GBS. It is important to note that young infants up to age 74 months can also develop septicemia, meningitis and other serious infections from GBS. Being treated with an antibiotic during labor does not reduce the chance that your baby will develop this later type of infection. There is currently no known way of preventing this later-onset GBS disease. WHERE TO GET MORE INFORMATION - Your healthcare provider is the best source of information for questions and concerns related to your medical problem. Etonogestrel implant What is this medicine? ETONOGESTREL is a contraceptive (birth control) device. It is used to prevent pregnancy. It can be used for up to 3 years. This medicine may be used for other purposes; ask your health care provider or pharmacist if you have questions. What should I tell my health care provider before I take this medicine? They need to know if you have any of these conditions: -abnormal vaginal bleeding -blood vessel disease or blood clots -cancer of the breast, cervix, or liver -depression -diabetes -gallbladder disease -headaches -heart disease or recent heart attack -high blood pressure -high cholesterol -kidney disease -liver disease -renal disease -seizures -tobacco smoker -an unusual or allergic reaction to etonogestrel, other hormones, anesthetics or antiseptics, medicines, foods, dyes, or preservatives -pregnant or trying to get pregnant -breast-feeding How should I use this medicine? This device is inserted just under the skin on the inner side of your upper arm by a health care professional. Talk to your pediatrician regarding the use of this medicine in children. Special care may be needed. Overdosage: If you think you've taken too much of this medicine contact a poison control center or emergency room at once. Overdosage: If you think you have taken too much of this medicine contact a poison control center  or emergency room at once. NOTE: This medicine is only for you. Do not share this medicine with others. What if I miss a dose? This does not apply. What may interact with this medicine? Do not take this medicine with any of the following medications: -amprenavir -bosentan -fosamprenavir This medicine may also interact with the following medications: -barbiturate medicines for inducing sleep or treating seizures -certain medicines for fungal infections like ketoconazole and itraconazole -griseofulvin -medicines to treat seizures like carbamazepine, felbamate, oxcarbazepine, phenytoin, topiramate -modafinil -phenylbutazone -rifampin -some medicines to treat HIV infection like atazanavir, indinavir, lopinavir, nelfinavir, tipranavir, ritonavir -St. John's wort This list may not describe all possible interactions. Give your health care provider a list of all the medicines, herbs, non-prescription drugs, or dietary supplements you use. Also tell them if you smoke, drink alcohol, or use illegal drugs. Some items may interact with your medicine. What should I watch for while using this medicine? This product does not protect you against HIV infection (AIDS) or other sexually transmitted diseases. You should be able to feel the implant by pressing your fingertips over the skin where it was inserted. Tell your doctor if you cannot feel the implant. What side effects may I notice from receiving this medicine? Side effects that you should report to your doctor or health care professional as soon as possible: -allergic  reactions like skin rash, itching or hives, swelling of the face, lips, or tongue -breast lumps -changes in vision -confusion, trouble speaking or understanding -dark urine -depressed mood -general ill feeling or flu-like symptoms -light-colored stools -loss of appetite, nausea -right upper belly pain -severe headaches -severe pain, swelling, or tenderness in the  abdomen -shortness of breath, chest pain, swelling in a leg -signs of pregnancy -sudden numbness or weakness of the face, arm or leg -trouble walking, dizziness, loss of balance or coordination -unusual vaginal bleeding, discharge -unusually weak or tired -yellowing of the eyes or skin Side effects that usually do not require medical attention (Report these to your doctor or health care professional if they continue or are bothersome.): -acne -breast pain -changes in weight -cough -fever or chills -headache -irregular menstrual bleeding -itching, burning, and vaginal discharge -pain or difficulty passing urine -sore throat This list may not describe all possible side effects. Call your doctor for medical advice about side effects. You may report side effects to FDA at 1-800-FDA-1088. Where should I keep my medicine? This drug is given in a hospital or clinic and will not be stored at home. NOTE: This sheet is a summary. It may not cover all possible information. If you have questions about this medicine, talk to your doctor, pharmacist, or health care provider.  2013, [employer]. (02/26/2009 3:54:17 PM)

## 2012-12-05 NOTE — Progress Notes (Signed)
Plans Nexplanon/breast feeding.

## 2012-12-07 LAB — STREP B DNA PROBE: GBSP: POSITIVE

## 2012-12-12 ENCOUNTER — Ambulatory Visit (INDEPENDENT_AMBULATORY_CARE_PROVIDER_SITE_OTHER): Payer: Medicaid Other | Admitting: Obstetrics & Gynecology

## 2012-12-12 ENCOUNTER — Encounter: Payer: Self-pay | Admitting: Obstetrics & Gynecology

## 2012-12-12 VITALS — BP 124/75 | Temp 98.4°F | Wt 286.2 lb

## 2012-12-12 DIAGNOSIS — E669 Obesity, unspecified: Secondary | ICD-10-CM

## 2012-12-12 DIAGNOSIS — O99210 Obesity complicating pregnancy, unspecified trimester: Secondary | ICD-10-CM

## 2012-12-12 DIAGNOSIS — Z3480 Encounter for supervision of other normal pregnancy, unspecified trimester: Secondary | ICD-10-CM

## 2012-12-12 DIAGNOSIS — Z3483 Encounter for supervision of other normal pregnancy, third trimester: Secondary | ICD-10-CM

## 2012-12-12 LAB — POCT URINALYSIS DIPSTICK
Bilirubin, UA: NEGATIVE
Blood, UA: NEGATIVE
Glucose, UA: NEGATIVE
Leukocytes, UA: NEGATIVE
Nitrite, UA: NEGATIVE
Urobilinogen, UA: NEGATIVE
pH, UA: 5

## 2012-12-12 NOTE — Progress Notes (Signed)
Pulse: 74

## 2012-12-12 NOTE — Progress Notes (Signed)
Musculoskeletal complaints.

## 2012-12-12 NOTE — Patient Instructions (Signed)
Group B Streptococcus Infection During Pregnancy Group B streptococcus (GBS) is a type of bacteria often found in healthy women. GBS usually does not cause any symptoms or harm to healthy adult women, but the bacteria can make a baby very sick if it is passed to the baby during childbirth. GBS is not a sexually transmitted disease (STD). GBS is different from Group A streptococcus, the bacteria that causes "strep throat." CAUSES  GBS bacteria can be found in the intestinal, reproductive, and urinary tracts of women. It can also be found in the female genital tract, most often in the vagina and rectal areas.  SYMPTOMS  In pregnancy, GBS can be in the following places:  Genital tract without symptoms.  Rectum without symptoms.  Urine with or without symptoms (asymptomatic bacteriuria).  Urinary symptoms can include pain, frequency, urgency, and blood with urination (cystitis). Pregnant women who are infected with GBS are at increased risk of:  Early (premature) labor and delivery.  Prolonged rupture of the membranes.  Infection in the following places:  Bladder.  Kidneys (pyelonephritis).  Bag of waters or placenta (chorioamnionitis).  Uterus (endometritis) after delivery. Newborns who are infected with GBS can develop:  Lung infection (pneumonia).  Blood infection (septicemia).  Infection of the lining of the brain and spinal cord (meningitis). DIAGNOSIS  Diagnosis of GBS infection is made by screening tests done when you are 35 to [redacted] weeks pregnant. The test (culture) is an easy swab of the vagina and rectum. A sample of your urine might also be checked for the bacteria. Talk with your caregiver about a plan for labor if your test shows that you carry the GBS bacteria. TREATMENT  If results of the culture are positive, showing that GBS is present, you likely will receive treatment with antibiotic medicines during labor. This will help prevent GBS from being passed to your baby.  The antibiotics work only if they are given during labor. If treatment is given earlier in pregnancy, the bacteria may regrow and be present during labor. Tell your caregiver if you are allergic to penicillin or other antibiotics. Antibiotics are also given if:   Infection of the membranes (amnionitis) is suspected.  Labor begins or there is rupture of the membranes before 37 weeks of pregnancy and there is a high risk of delivering the baby.  The mother has a past history of giving birth to an infant with GBS infection.  The culture status is unknown (culture not performed or result not available) and there is:  Fever during labor.  Preterm labor (less than 37 weeks of pregnancy).  Prolonged rupture of membranes (18 hours or more). Treatment of the mother during labor is not recommended when:  A planned cesarean delivery is done and there is no labor or ruptured membranes. This is true even if the mother tested positive for GBS.  There is a negative culture for GBS screening during the pregnancy, regardless of the risk factors during labor. The infant will receive antibiotics if the infant tested positive for GBS or has signs and symptoms that suggest GBS infection is present. It is not recommended to routinely give antibiotics to infants whose mothers received antibiotic treatment during labor. HOME CARE INSTRUCTIONS   Take all antibiotics as prescribed by your caregiver.  Only take medicine as directed by your caregiver.  Continue with prenatal visits and care.  Return for follow-up appointments and cultures.  Follow your caregiver's instructions. SEEK MEDICAL CARE IF:   You have pain with urination.    You have frequent urination.  You have blood in your urine. SEEK IMMEDIATE MEDICAL CARE IF:  You have a fever.  You have pain in the back, side, or uterus.  You have chills.  You have abdominal swelling (distension) or pain.  You have labor pains (contractions) every  10 minutes or more often.  You are leaking fluid or bleeding from your vagina.  You have pelvic pressure that feels like your baby is pushing down.  You have a low, dull backache.  You have cramps that feel like your period.  You have abdominal cramps with or without diarrhea.  You have repeated vomiting and diarrhea.  You have trouble breathing.  You develop confusion.  You have stiffness of your body or neck. MAKE SURE YOU:   Understand these instructions.  Will watch your condition.  Will get help right away if you are not doing well or get worse. Document Released: 09/12/2007 Document Revised: 08/28/2011 Document Reviewed: 10/16/2010 [employer] Patient Information 2014 [employer].  

## 2012-12-19 ENCOUNTER — Ambulatory Visit (INDEPENDENT_AMBULATORY_CARE_PROVIDER_SITE_OTHER): Payer: BC Managed Care – PPO | Admitting: Obstetrics & Gynecology

## 2012-12-19 ENCOUNTER — Encounter: Payer: Medicaid Other | Admitting: Obstetrics & Gynecology

## 2012-12-19 ENCOUNTER — Encounter: Payer: Self-pay | Admitting: Obstetrics & Gynecology

## 2012-12-19 VITALS — BP 118/80 | Temp 98.4°F | Wt 287.8 lb

## 2012-12-19 DIAGNOSIS — Z3483 Encounter for supervision of other normal pregnancy, third trimester: Secondary | ICD-10-CM

## 2012-12-19 DIAGNOSIS — Z3480 Encounter for supervision of other normal pregnancy, unspecified trimester: Secondary | ICD-10-CM

## 2012-12-19 LAB — POCT URINALYSIS DIPSTICK
Blood, UA: NEGATIVE
Glucose, UA: NEGATIVE
Nitrite, UA: NEGATIVE
Spec Grav, UA: 1.015
Urobilinogen, UA: NEGATIVE

## 2012-12-19 NOTE — Progress Notes (Signed)
Doing well 

## 2012-12-19 NOTE — Progress Notes (Signed)
Pulse: 73 Patient states she has no concerns.  

## 2012-12-26 ENCOUNTER — Ambulatory Visit (INDEPENDENT_AMBULATORY_CARE_PROVIDER_SITE_OTHER): Payer: BC Managed Care – PPO | Admitting: Obstetrics & Gynecology

## 2012-12-26 ENCOUNTER — Other Ambulatory Visit: Payer: Medicaid Other

## 2012-12-26 ENCOUNTER — Encounter: Payer: Self-pay | Admitting: Obstetrics & Gynecology

## 2012-12-26 VITALS — BP 124/83 | Temp 97.8°F | Wt 286.0 lb

## 2012-12-26 DIAGNOSIS — Z3483 Encounter for supervision of other normal pregnancy, third trimester: Secondary | ICD-10-CM

## 2012-12-26 DIAGNOSIS — Z3480 Encounter for supervision of other normal pregnancy, unspecified trimester: Secondary | ICD-10-CM

## 2012-12-26 DIAGNOSIS — D573 Sickle-cell trait: Secondary | ICD-10-CM

## 2012-12-26 DIAGNOSIS — E669 Obesity, unspecified: Secondary | ICD-10-CM

## 2012-12-26 LAB — POCT URINALYSIS DIPSTICK
Ketones, UA: NEGATIVE
Protein, UA: NEGATIVE
Spec Grav, UA: 1.015
Urobilinogen, UA: NEGATIVE
pH, UA: 5

## 2012-12-26 NOTE — Patient Instructions (Signed)

## 2012-12-26 NOTE — Progress Notes (Signed)
Pulse-81 No complaints.  

## 2012-12-26 NOTE — Progress Notes (Signed)
Doing well 

## 2012-12-29 ENCOUNTER — Inpatient Hospital Stay (HOSPITAL_COMMUNITY)
Admission: AD | Admit: 2012-12-29 | Discharge: 2013-01-01 | DRG: 371 | Disposition: A | Discharge status: Home / Self Care | Payer: BC Managed Care – PPO | Source: Ambulatory Visit | Attending: Obstetrics & Gynecology | Admitting: Obstetrics & Gynecology

## 2012-12-29 ENCOUNTER — Inpatient Hospital Stay (HOSPITAL_COMMUNITY): Payer: BC Managed Care – PPO | Admitting: Anesthesiology

## 2012-12-29 ENCOUNTER — Encounter (HOSPITAL_COMMUNITY): Payer: Self-pay | Admitting: Obstetrics and Gynecology

## 2012-12-29 ENCOUNTER — Encounter (HOSPITAL_COMMUNITY): Payer: Self-pay | Admitting: Anesthesiology

## 2012-12-29 ENCOUNTER — Encounter (HOSPITAL_COMMUNITY)
Admission: AD | Disposition: A | Discharge status: Home / Self Care | Payer: Self-pay | Source: Ambulatory Visit | Attending: Obstetrics & Gynecology

## 2012-12-29 DIAGNOSIS — E669 Obesity, unspecified: Secondary | ICD-10-CM

## 2012-12-29 DIAGNOSIS — Z98891 History of uterine scar from previous surgery: Secondary | ICD-10-CM

## 2012-12-29 DIAGNOSIS — Z22330 Carrier of Group B streptococcus: Secondary | ICD-10-CM

## 2012-12-29 DIAGNOSIS — IMO0001 Reserved for inherently not codable concepts without codable children: Secondary | ICD-10-CM

## 2012-12-29 DIAGNOSIS — O99892 Other specified diseases and conditions complicating childbirth: Secondary | ICD-10-CM | POA: Diagnosis present

## 2012-12-29 DIAGNOSIS — O99214 Obesity complicating childbirth: Secondary | ICD-10-CM

## 2012-12-29 LAB — CBC
HCT: 35.7 % — ABNORMAL LOW (ref 36.0–46.0)
MCH: 25.9 pg — ABNORMAL LOW (ref 26.0–34.0)
MCV: 75.3 fL — ABNORMAL LOW (ref 78.0–100.0)
Platelets: 170 10*3/uL (ref 150–400)
RDW: 15.3 % (ref 11.5–15.5)

## 2012-12-29 LAB — TYPE AND SCREEN

## 2012-12-29 SURGERY — Surgical Case
Anesthesia: Epidural | Site: Abdomen | Wound class: Clean Contaminated

## 2012-12-29 MED ORDER — DIPHENHYDRAMINE HCL 50 MG/ML IJ SOLN: 12.5000 mg | INTRAMUSCULAR | Status: DC | PRN

## 2012-12-29 MED ORDER — SODIUM BICARBONATE 8.4 % IV SOLN
INTRAVENOUS | Status: DC | PRN
  Administered 2012-12-29 (×2): 5 mL via EPIDURAL

## 2012-12-29 MED ORDER — TERBUTALINE SULFATE 1 MG/ML IJ SOLN: 0.2500 mg | Freq: Once | INTRAMUSCULAR | Status: DC

## 2012-12-29 MED ORDER — PENICILLIN G POTASSIUM 5000000 UNITS IJ SOLR
2.5000 10*6.[IU] | INTRAVENOUS | Status: DC
  Filled 2012-12-29 (×4): qty 2.5

## 2012-12-29 MED ORDER — LIDOCAINE HCL (PF) 1 % IJ SOLN
30.0000 mL | INTRAMUSCULAR | Status: DC | PRN
  Filled 2012-12-29: qty 30

## 2012-12-29 MED ORDER — OXYTOCIN 40 UNITS IN LACTATED RINGERS INFUSION - SIMPLE MED
62.5000 mL/h | INTRAVENOUS | Status: DC
  Filled 2012-12-29: qty 1000

## 2012-12-29 MED ORDER — LIDOCAINE HCL (PF) 1 % IJ SOLN
INTRAMUSCULAR | Status: DC | PRN
  Administered 2012-12-29: 3 mL
  Administered 2012-12-29 (×2): 5 mL

## 2012-12-29 MED ORDER — TERBUTALINE SULFATE 1 MG/ML IJ SOLN
INTRAMUSCULAR | Status: AC
  Filled 2012-12-29: qty 1

## 2012-12-29 MED ORDER — ONDANSETRON HCL 4 MG/2ML IJ SOLN
4.0000 mg | Freq: Four times a day (QID) | INTRAMUSCULAR | Status: DC | PRN
  Administered 2012-12-29: 4 mg via INTRAVENOUS
  Filled 2012-12-29: qty 2

## 2012-12-29 MED ORDER — MORPHINE SULFATE (PF) 0.5 MG/ML IJ SOLN
INTRAMUSCULAR | Status: DC | PRN
  Administered 2012-12-29: 1 mg via EPIDURAL

## 2012-12-29 MED ORDER — OXYTOCIN BOLUS FROM INFUSION: 500.0000 mL | INTRAVENOUS | Status: DC

## 2012-12-29 MED ORDER — OXYTOCIN 10 UNIT/ML IJ SOLN
40.0000 [IU] | INTRAVENOUS | Status: DC | PRN
  Administered 2012-12-29: 40 [IU] via INTRAVENOUS

## 2012-12-29 MED ORDER — DIPHENHYDRAMINE HCL 50 MG/ML IJ SOLN
INTRAMUSCULAR | Status: DC | PRN
  Administered 2012-12-29: 12.5 mg via INTRAVENOUS

## 2012-12-29 MED ORDER — LACTATED RINGERS IV SOLN
INTRAVENOUS | Status: DC | PRN
  Administered 2012-12-29: 19:00:00 via INTRAVENOUS

## 2012-12-29 MED ORDER — KETOROLAC TROMETHAMINE 60 MG/2ML IM SOLN
INTRAMUSCULAR | Status: AC
  Filled 2012-12-29: qty 2

## 2012-12-29 MED ORDER — SCOPOLAMINE 1 MG/3DAYS TD PT72
MEDICATED_PATCH | TRANSDERMAL | Status: AC
  Filled 2012-12-29: qty 1

## 2012-12-29 MED ORDER — LACTATED RINGERS IV SOLN
INTRAVENOUS | Status: DC
  Administered 2012-12-29: 15:00:00 via INTRAVENOUS

## 2012-12-29 MED ORDER — KETOROLAC TROMETHAMINE 60 MG/2ML IM SOLN
60.0000 mg | Freq: Once | INTRAMUSCULAR | Status: AC | PRN
  Administered 2012-12-29: 60 mg via INTRAMUSCULAR

## 2012-12-29 MED ORDER — DEXTROSE 5 % IV SOLN
500.0000 mg | Freq: Once | INTRAVENOUS | Status: AC
  Administered 2012-12-29: 250 mg via INTRAVENOUS
  Filled 2012-12-29: qty 500

## 2012-12-29 MED ORDER — ONDANSETRON HCL 4 MG/2ML IJ SOLN
INTRAMUSCULAR | Status: DC | PRN
  Administered 2012-12-29: 4 mg via INTRAVENOUS

## 2012-12-29 MED ORDER — 0.9 % SODIUM CHLORIDE (POUR BTL) OPTIME
TOPICAL | Status: DC | PRN
  Administered 2012-12-29: 600 mL
  Administered 2012-12-29: 200 mL

## 2012-12-29 MED ORDER — LACTATED RINGERS IV SOLN: INTRAVENOUS | Status: DC

## 2012-12-29 MED ORDER — IBUPROFEN 600 MG PO TABS: 600.0000 mg | ORAL_TABLET | Freq: Four times a day (QID) | ORAL | Status: DC | PRN

## 2012-12-29 MED ORDER — MEPERIDINE HCL 25 MG/ML IJ SOLN: 6.2500 mg | INTRAMUSCULAR | Status: DC | PRN

## 2012-12-29 MED ORDER — TERBUTALINE SULFATE 1 MG/ML IJ SOLN: 0.2500 mg | Freq: Once | INTRAMUSCULAR | Status: DC | PRN

## 2012-12-29 MED ORDER — FENTANYL 2.5 MCG/ML BUPIVACAINE 1/10 % EPIDURAL INFUSION (WH - ANES)
INTRAMUSCULAR | Status: DC | PRN
  Administered 2012-12-29: 14 mL/h via EPIDURAL

## 2012-12-29 MED ORDER — PHENYLEPHRINE 40 MCG/ML (10ML) SYRINGE FOR IV PUSH (FOR BLOOD PRESSURE SUPPORT)
80.0000 ug | PREFILLED_SYRINGE | INTRAVENOUS | Status: DC | PRN
  Filled 2012-12-29: qty 2

## 2012-12-29 MED ORDER — BUTORPHANOL TARTRATE 1 MG/ML IJ SOLN
2.0000 mg | Freq: Once | INTRAMUSCULAR | Status: AC
  Administered 2012-12-29: 2 mg via INTRAVENOUS
  Filled 2012-12-29: qty 2

## 2012-12-29 MED ORDER — CEFAZOLIN SODIUM 10 G IJ SOLR
3.0000 g | Freq: Once | INTRAMUSCULAR | Status: AC
  Administered 2012-12-29: 3 g via INTRAVENOUS
  Filled 2012-12-29: qty 3000

## 2012-12-29 MED ORDER — EPHEDRINE 5 MG/ML INJ
10.0000 mg | INTRAVENOUS | Status: AC | PRN
  Administered 2012-12-29 (×2): 10 mg via INTRAVENOUS

## 2012-12-29 MED ORDER — ACETAMINOPHEN 325 MG PO TABS: 650.0000 mg | ORAL_TABLET | ORAL | Status: DC | PRN

## 2012-12-29 MED ORDER — LACTATED RINGERS IV SOLN
INTRAVENOUS | Status: DC
  Administered 2012-12-29: 17:00:00 via INTRAUTERINE

## 2012-12-29 MED ORDER — OXYTOCIN 40 UNITS IN LACTATED RINGERS INFUSION - SIMPLE MED: 1.0000 m[IU]/min | INTRAVENOUS | Status: DC

## 2012-12-29 MED ORDER — OXYCODONE-ACETAMINOPHEN 5-325 MG PO TABS: 1.0000 | ORAL_TABLET | ORAL | Status: DC | PRN

## 2012-12-29 MED ORDER — FENTANYL 2.5 MCG/ML BUPIVACAINE 1/10 % EPIDURAL INFUSION (WH - ANES)
14.0000 mL/h | INTRAMUSCULAR | Status: DC | PRN
  Filled 2012-12-29: qty 125

## 2012-12-29 MED ORDER — PROMETHAZINE HCL 25 MG/ML IJ SOLN: 6.2500 mg | INTRAMUSCULAR | Status: DC | PRN

## 2012-12-29 MED ORDER — PHENYLEPHRINE 40 MCG/ML (10ML) SYRINGE FOR IV PUSH (FOR BLOOD PRESSURE SUPPORT)
80.0000 ug | PREFILLED_SYRINGE | INTRAVENOUS | Status: DC | PRN
  Filled 2012-12-29: qty 2
  Filled 2012-12-29: qty 5

## 2012-12-29 MED ORDER — ONDANSETRON HCL 4 MG/2ML IJ SOLN
INTRAMUSCULAR | Status: AC
  Filled 2012-12-29: qty 2

## 2012-12-29 MED ORDER — LACTATED RINGERS IV SOLN
500.0000 mL | Freq: Once | INTRAVENOUS | Status: AC
  Administered 2012-12-29: 13:00:00 via INTRAVENOUS

## 2012-12-29 MED ORDER — OXYTOCIN 10 UNIT/ML IJ SOLN
INTRAMUSCULAR | Status: AC
  Filled 2012-12-29: qty 4

## 2012-12-29 MED ORDER — PENICILLIN G POTASSIUM 5000000 UNITS IJ SOLR
5.0000 10*6.[IU] | Freq: Once | INTRAVENOUS | Status: AC
  Administered 2012-12-29: 5 10*6.[IU] via INTRAVENOUS
  Filled 2012-12-29: qty 5

## 2012-12-29 MED ORDER — FENTANYL CITRATE 0.05 MG/ML IJ SOLN: 25.0000 ug | INTRAMUSCULAR | Status: DC | PRN

## 2012-12-29 MED ORDER — FLEET ENEMA 7-19 GM/118ML RE ENEM: 1.0000 | ENEMA | Freq: Every day | RECTAL | Status: DC | PRN

## 2012-12-29 MED ORDER — MORPHINE SULFATE 0.5 MG/ML IJ SOLN
INTRAMUSCULAR | Status: AC
  Filled 2012-12-29: qty 10

## 2012-12-29 MED ORDER — LACTATED RINGERS IV SOLN
500.0000 mL | INTRAVENOUS | Status: DC | PRN
  Administered 2012-12-29: 1000 mL via INTRAVENOUS

## 2012-12-29 MED ORDER — CITRIC ACID-SODIUM CITRATE 334-500 MG/5ML PO SOLN
30.0000 mL | ORAL | Status: DC | PRN
  Administered 2012-12-29: 30 mL via ORAL
  Filled 2012-12-29: qty 15

## 2012-12-29 MED ORDER — PHENYLEPHRINE HCL 10 MG/ML IJ SOLN
INTRAMUSCULAR | Status: DC | PRN
  Administered 2012-12-29 (×2): 80 ug via INTRAVENOUS
  Administered 2012-12-29: 60 ug via INTRAVENOUS
  Administered 2012-12-29: 20 ug via INTRAVENOUS
  Administered 2012-12-29 (×2): 80 ug via INTRAVENOUS

## 2012-12-29 MED ORDER — SCOPOLAMINE 1 MG/3DAYS TD PT72
1.0000 | MEDICATED_PATCH | Freq: Once | TRANSDERMAL | Status: DC
  Administered 2012-12-29: 1.5 mg via TRANSDERMAL

## 2012-12-29 MED ORDER — EPHEDRINE 5 MG/ML INJ
10.0000 mg | INTRAVENOUS | Status: DC | PRN
  Administered 2012-12-29: 10 mg via INTRAVENOUS
  Filled 2012-12-29: qty 2
  Filled 2012-12-29 (×2): qty 4

## 2012-12-29 MED ORDER — MORPHINE SULFATE (PF) 0.5 MG/ML IJ SOLN
INTRAMUSCULAR | Status: DC | PRN
  Administered 2012-12-29: 4 mg via EPIDURAL

## 2012-12-29 SURGICAL SUPPLY — 41 items
APL SKNCLS STERI-STRIP NONHPOA (GAUZE/BANDAGES/DRESSINGS) ×1
BENZOIN TINCTURE PRP APPL 2/3 (GAUZE/BANDAGES/DRESSINGS) ×2 IMPLANT
CANISTER WOUND CARE 500ML ATS (WOUND CARE) IMPLANT
CLAMP CORD UMBIL (MISCELLANEOUS) IMPLANT
CLOTH BEACON ORANGE TIMEOUT ST (SAFETY) ×2 IMPLANT
CONTAINER PREFILL 10% NBF 15ML (MISCELLANEOUS) IMPLANT
DRAPE LG THREE QUARTER DISP (DRAPES) ×2 IMPLANT
DRSG OPSITE POSTOP 4X10 (GAUZE/BANDAGES/DRESSINGS) ×2 IMPLANT
DRSG VAC ATS LRG SENSATRAC (GAUZE/BANDAGES/DRESSINGS) IMPLANT
DRSG VAC ATS MED SENSATRAC (GAUZE/BANDAGES/DRESSINGS) IMPLANT
DRSG VAC ATS SM SENSATRAC (GAUZE/BANDAGES/DRESSINGS) IMPLANT
DURAPREP 26ML APPLICATOR (WOUND CARE) ×1 IMPLANT
ELECT REM PT RETURN 9FT ADLT (ELECTROSURGICAL) ×2
ELECTRODE REM PT RTRN 9FT ADLT (ELECTROSURGICAL) ×1 IMPLANT
EXTRACTOR VACUUM M CUP 4 TUBE (SUCTIONS) IMPLANT
GLOVE BIO SURGEON STRL SZ 6.5 (GLOVE) ×2 IMPLANT
GOWN STRL REIN XL XLG (GOWN DISPOSABLE) ×4 IMPLANT
KIT ABG SYR 3ML LUER SLIP (SYRINGE) ×1 IMPLANT
NDL HYPO 25X5/8 SAFETYGLIDE (NEEDLE) ×1 IMPLANT
NEEDLE HYPO 25X5/8 SAFETYGLIDE (NEEDLE) ×2 IMPLANT
NS IRRIG 1000ML POUR BTL (IV SOLUTION) ×2 IMPLANT
PACK C SECTION WH (CUSTOM PROCEDURE TRAY) ×2 IMPLANT
PAD OB MATERNITY 4.3X12.25 (PERSONAL CARE ITEMS) ×2 IMPLANT
RTRCTR C-SECT PINK 25CM LRG (MISCELLANEOUS) IMPLANT
SCRUB PCMX 4 OZ (MISCELLANEOUS) ×2 IMPLANT
STAPLER VISISTAT 35W (STAPLE) IMPLANT
STRIP CLOSURE SKIN 1/2X4 (GAUZE/BANDAGES/DRESSINGS) ×2 IMPLANT
SUT MNCRL 0 VIOLET CTX 36 (SUTURE) ×2 IMPLANT
SUT MNCRL AB 3-0 PS2 27 (SUTURE) ×1 IMPLANT
SUT MONOCRYL 0 CTX 36 (SUTURE) ×2
SUT PDS AB 0 CTX 36 PDP370T (SUTURE) ×1 IMPLANT
SUT PLAIN 0 NONE (SUTURE) IMPLANT
SUT VIC AB 0 CTXB 36 (SUTURE) ×2 IMPLANT
SUT VIC AB 2-0 CT1 (SUTURE) ×2 IMPLANT
SUT VIC AB 2-0 CT1 27 (SUTURE) ×2
SUT VIC AB 2-0 CT1 TAPERPNT 27 (SUTURE) ×1 IMPLANT
SUT VIC AB 2-0 SH 27 (SUTURE)
SUT VIC AB 2-0 SH 27XBRD (SUTURE) IMPLANT
TOWEL OR 17X24 6PK STRL BLUE (TOWEL DISPOSABLE) ×6 IMPLANT
TRAY FOLEY CATH 14FR (SET/KITS/TRAYS/PACK) ×1 IMPLANT
WATER STERILE IRR 1000ML POUR (IV SOLUTION) ×2 IMPLANT

## 2012-12-29 NOTE — Transfer of Care (Signed)
Immediate Anesthesia Transfer of Care Note  Patient: [name] [name]  Procedure(s) Performed: Procedure(s): Primary CESAREAN SECTION  of baby  boy at 65 APGAR 8/9 (N/A)  Patient Location: PACU  Anesthesia Type:Regional  Level of Consciousness: awake, alert  and oriented  Airway & Oxygen Therapy: Patient Spontanous Breathing  Post-op Assessment: Report given to PACU RN and Post -op Vital signs reviewed and stable  Post vital signs: Reviewed and stable  Complications: No apparent anesthesia complications

## 2012-12-29 NOTE — Progress Notes (Signed)
[name] [name] is a 32 y.o. [id number] at [redacted]w[redacted]d by LMP admitted for active labor  Subjective: Comfortable  Objective: BP 107/59  Pulse 76  Temp(Src) 98 F (36.7 C) (Oral)  Resp 20  Ht 5' 6.5" (1.689 m)  Wt 288 lb 12.8 oz (130.999 kg)  BMI 45.92 kg/m2  SpO2 100%  LMP 03/30/2012   Total I/O In: -  Out: 250 [Urine:250]  FHT:  FHR: 120 bpm, variability: moderate,  accelerations:  Present,  decelerations:  Absent UC:   irregular, every 7 minutes SVE:   Dilation: 6 Effacement (%): 80 Station: -2 Exam by:: Rzhang,rnc-ob  Labs: Lab Results  Component Value Date   WBC 7.0 12/29/2012   HGB 12.3 12/29/2012   HCT 35.7* 12/29/2012   MCV 75.3* 12/29/2012   PLT 170 12/29/2012    Assessment / Plan: Protracted latent phase  Labor: see above; augment labor with Pitocin Preeclampsia:  n/a Fetal Wellbeing:  Category I Pain Control:  Epidural I/D:  n/a Anticipated MOD:  NSVD  JACKSON-MOORE,Neliah Cuyler A 12/29/2012, 4:40 PM

## 2012-12-29 NOTE — Anesthesia Postprocedure Evaluation (Signed)
  Anesthesia Post-op Note  Patient: [name] [name]  Procedure(s) Performed: Procedure(s) (LRB): Primary CESAREAN SECTION  of baby  boy at 70 APGAR 8/9 (N/A)  Patient Location: PACU  Anesthesia Type: Epidural  Level of Consciousness: awake and alert   Airway and Oxygen Therapy: Patient Spontanous Breathing  Post-op Pain: mild  Post-op Assessment: Post-op Vital signs reviewed, Patient's Cardiovascular Status Stable, Respiratory Function Stable, Patent Airway and No signs of Nausea or vomiting  Last Vitals:  Filed Vitals:   12/29/12 2130  BP: 96/50  Pulse: 74  Temp:   Resp: 15    Post-op Vital Signs: stable   Complications: No apparent anesthesia complications

## 2012-12-29 NOTE — Anesthesia Preprocedure Evaluation (Addendum)
Anesthesia Evaluation  Patient identified by MRN, date of birth, ID band Patient awake    Reviewed: Allergy & Precautions, H&P , NPO status , Patient's Chart, lab work & pertinent test results  History of Anesthesia Complications Negative for: history of anesthetic complications  Airway Mallampati: II TM Distance: >3 FB Neck ROM: full    Dental no notable dental hx. (+) Teeth Intact   Pulmonary neg pulmonary ROS,  breath sounds clear to auscultation  Pulmonary exam normal       Cardiovascular negative cardio ROS  Rhythm:regular Rate:Normal     Neuro/Psych negative neurological ROS  negative psych ROS   GI/Hepatic negative GI ROS, Neg liver ROS,   Endo/Other  negative endocrine ROSMorbid obesity  Renal/GU negative Renal ROS  negative genitourinary   Musculoskeletal   Abdominal Normal abdominal exam  (+)   Peds  Hematology negative hematology ROS (+)   Anesthesia Other Findings   Reproductive/Obstetrics (+) Pregnancy                           Anesthesia Physical Anesthesia Plan  ASA: III and emergent  Anesthesia Plan: Epidural   Post-op Pain Management:    Induction:   Airway Management Planned:   Additional Equipment:   Intra-op Plan:   Post-operative Plan:   Informed Consent: I have reviewed the patients History and Physical, chart, labs and discussed the procedure including the risks, benefits and alternatives for the proposed anesthesia with the patient or authorized representative who has indicated his/her understanding and acceptance.     Plan Discussed with:   Anesthesia Plan Comments:        Anesthesia Quick Evaluation

## 2012-12-29 NOTE — Progress Notes (Signed)
Patient ID: [name] [name], female   DOB: December 15, 1980, 32 y.o.   MRN: [id number] [name] [name] is a 32 y.o. G4P1021 at [redacted]w[redacted]d by LMP admitted for active labor  Subjective: Comfortable  Objective: BP 103/54  Pulse 97  Temp(Src) 98 F (36.7 C) (Oral)  Resp 18  Ht 5' 6.5" (1.689 m)  Wt 288 lb 12.8 oz (130.999 kg)  BMI 45.92 kg/m2  SpO2 94%  LMP 03/30/2012   Total I/O In: -  Out: 400 [Urine:400]  FHT:  FHR: 120 bpm, variability: diminished,  accelerations:  absent,  decelerations: repetitive, severe late decelerations UC:   irregular, every 3 minutes SVE:   Dilation: 7 Effacement (%): 100 Station: -1;-2 Exam by:: Rzhang,rnc-ob  Labs: Lab Results  Component Value Date   WBC 7.0 12/29/2012   HGB 12.3 12/29/2012   HCT 35.7* 12/29/2012   MCV 75.3* 12/29/2012   PLT 170 12/29/2012    Assessment / Plan: Protracted active phase  Labor: FHT precludes attempts to augment labor with Pitocin Preeclampsia:  n/a Fetal Wellbeing:  Category III Pain Control:  Epidural I/D:  n/a Anticipated MOD:  C/D--urgent  JACKSON-MOORE,Chanceler Pullin A 12/29/2012, 6:31 PM

## 2012-12-29 NOTE — Op Note (Signed)
Cesarean Section Procedure Note   [name] [name]   12/29/2012  Indications: Suspicious fetal heart tracing with repetitive, severe late decelerations   Pre-operative Diagnosis:  Suspicious fetal heart tracing with repetitive, severe late decelerations  Post-operative Diagnosis: Same   Surgeon: Antionette Char A  Assistants: none  Anesthesia: epidural  Procedure Details:  The patient was seen in the Holding Room. The risks, benefits, complications, treatment options, and expected outcomes were discussed with the patient. The patient concurred with the proposed plan, giving informed consent. The patient was identified as [name] and the procedure verified as C-Section Delivery. A Time Out was held and the above information confirmed.  After induction of anesthesia, the patient was draped and prepped in the usual sterile manner. A transverse incision was made and carried down through the subcutaneous tissue to the fascia. The fascial incision was made and extended transversely. The peritoneum was identified and entered. The peritoneal incision was extended. The utero-vesical peritoneal reflection was incised transversely and the bladder flap was bluntly freed from the lower uterine segment. A low transverse uterine incision was made. Delivered from cephalic presentation was a  living newborn infant. APGAR (1 MIN): 8   APGAR (5 MINS): 9   A cord ph was sent. The umbilical cord was clamped and cut cord. A sample was obtained for evaluation. The placenta was removed Intact and appeared normal.  The uterine incision was closed with running locked sutures of 1-0 Monocryl. A second imbricating layer of the same suture was placed.  Hemostasis was observed. The paracolic gutters were irrigated. The parieto peritoneum was closed in a running fashion with 2-0 Vicryl.  The fascia was then reapproximated with running sutures of 0 Vicryl.   A running suture of 2-0 Vicryl was placed in the subcutaneous  layer.   The skin was closed with suture.  Instrument, sponge, and needle counts were correct prior the abdominal closure and were correct at the conclusion of the case.    Findings:  See above   Estimated Blood Loss: 600 ml  Total IV Fluids: per Anesthesiology  Urine Output: per Anesthesiology  Specimens: Placenta to Pathology  Complications: no complications  Disposition: PACU - hemodynamically stable.  Maternal Condition: stable   Baby condition / location:  nursery-stable    Signed: Surgeon(s): Antionette Char, MD

## 2012-12-29 NOTE — MAU Note (Signed)
[name] [name] is here today with complaints of contractions that started this morning around 0930-1000. She started feeling "light" contractions yesterday and they have progressed to strong this morning. She is [redacted]w[redacted]d; G4P1

## 2012-12-29 NOTE — Anesthesia Procedure Notes (Signed)
Epidural Patient location during procedure: OB  Staffing Anesthesiologist: Phillips Grout Performed by: anesthesiologist   Preanesthetic Checklist Completed: patient identified, site marked, surgical consent, pre-op evaluation, timeout performed, IV checked, risks and benefits discussed and monitors and equipment checked  Epidural Patient position: sitting Prep: ChloraPrep Patient monitoring: heart rate, continuous pulse ox and blood pressure Approach: midline Injection technique: LOR saline  Needle:  Needle type: Tuohy  Needle gauge: 17 G Needle length: 9 cm and 9 Needle insertion depth: 8 cm Catheter type: closed end flexible Catheter size: 20 Guage Catheter at skin depth: 14 cm Test dose: negative  Assessment Events: blood not aspirated, injection not painful, no injection resistance, negative IV test and no paresthesia  Additional Notes   Patient tolerated the insertion well without complications.

## 2012-12-29 NOTE — MAU Note (Signed)
Pt reports reports having ctx q 15-20 min. Deneis SROM or bleeding and reports good fetal movment

## 2012-12-29 NOTE — H&P (Signed)
[name] [name] is a 32 y.o. female presenting for contractions. Maternal Medical History:  Reason for admission: Contractions and vaginal bleeding.   Contractions: Frequency: regular.   Perceived severity is strong.    Prenatal complications: no prenatal complications Prenatal Complications - Diabetes: none.    OB History   Grav Para Term Preterm Abortions TAB SAB Ect Mult Living   4 1 1  2 1 1   1      Past Medical History  Diagnosis Date  . Depression   . Anxiety   . Migraine    Past Surgical History  Procedure Laterality Date  . No past surgeries     Family History: family history includes Cancer in her mother and Diabetes in her mother. Social History:  reports that she has never smoked. She does not have any smokeless tobacco history on file. She reports that she does not drink alcohol or use illicit drugs.     Review of Systems  Constitutional: Negative for fever.  Eyes: Negative for blurred vision.  Respiratory: Negative for shortness of breath.   Gastrointestinal: Negative for vomiting.  Skin: Negative for rash.  Neurological: Negative for headaches.    Dilation: 5 Effacement (%): 90 Station: -2 Exam by:: J. Rasch RN  Blood pressure 127/79, pulse 84, temperature 98.3 F (36.8 C), temperature source Oral, resp. rate 18, height 5' 6.5" (1.689 m), weight 288 lb 12.8 oz (130.999 kg), last menstrual period 03/30/2012. Maternal Exam:  Uterine Assessment: Contraction frequency is regular.   Abdomen: not evaluated.  Introitus: not evaluated.   Cervix: Cervix evaluated by digital exam.     Fetal Exam Fetal Monitor Review: Variability: moderate (6-25 bpm).   Pattern: accelerations present and no decelerations.    Fetal State Assessment: Category I - tracings are normal.     Physical Exam  Constitutional: She appears well-developed.  HENT:  Head: Normocephalic.  Neck: Neck supple. No thyromegaly present.  Cardiovascular: Normal rate and regular rhythm.    Respiratory: Breath sounds normal.  GI: Soft. Bowel sounds are normal.  Skin: No rash noted.    Prenatal labs: ABO, Rh: O/Positive/-- (03/13 0000) Antibody:   Rubella: Immune (03/13 0000) RPR: NON REAC (04/10 1147)  HBsAg: Negative (03/13 0000)  HIV: NON REACTIVE (04/10 1147)  GBS: POSITIVE (06/19 1713)   Assessment/Plan: Multipara at term, active labor, Category 1 FHT GBS positive Admit, GBS prophylaxis, anticipate an NSVD   [name] [name],[name] [name] A 12/29/2012, 1:38 PM

## 2012-12-30 ENCOUNTER — Encounter (HOSPITAL_COMMUNITY): Payer: Self-pay | Admitting: Obstetrics & Gynecology

## 2012-12-30 LAB — CBC
HCT: 30 % — ABNORMAL LOW (ref 36.0–46.0)
MCHC: 34.3 g/dL (ref 30.0–36.0)
Platelets: 163 10*3/uL (ref 150–400)
RDW: 15.4 % (ref 11.5–15.5)

## 2012-12-30 MED ORDER — DIPHENHYDRAMINE HCL 25 MG PO CAPS: 25.0000 mg | ORAL_CAPSULE | ORAL | Status: DC | PRN

## 2012-12-30 MED ORDER — SENNOSIDES-DOCUSATE SODIUM 8.6-50 MG PO TABS
2.0000 | ORAL_TABLET | Freq: Every day | ORAL | Status: DC
  Administered 2012-12-30 – 2012-12-31 (×2): 2 via ORAL

## 2012-12-30 MED ORDER — NALOXONE HCL 1 MG/ML IJ SOLN
1.0000 ug/kg/h | INTRAVENOUS | Status: DC | PRN
  Filled 2012-12-30: qty 2

## 2012-12-30 MED ORDER — SIMETHICONE 80 MG PO CHEW
80.0000 mg | CHEWABLE_TABLET | ORAL | Status: DC | PRN
  Administered 2012-12-30 – 2013-01-01 (×5): 80 mg via ORAL

## 2012-12-30 MED ORDER — IBUPROFEN 600 MG PO TABS
600.0000 mg | ORAL_TABLET | Freq: Four times a day (QID) | ORAL | Status: DC
  Administered 2012-12-30 – 2013-01-01 (×9): 600 mg via ORAL
  Filled 2012-12-30 (×9): qty 1

## 2012-12-30 MED ORDER — KETOROLAC TROMETHAMINE 30 MG/ML IJ SOLN
30.0000 mg | Freq: Four times a day (QID) | INTRAMUSCULAR | Status: AC | PRN
  Administered 2012-12-30: 30 mg via INTRAVENOUS
  Filled 2012-12-30: qty 1

## 2012-12-30 MED ORDER — MEDROXYPROGESTERONE ACETATE 150 MG/ML IM SUSP: 150.0000 mg | INTRAMUSCULAR | Status: DC | PRN

## 2012-12-30 MED ORDER — FERROUS SULFATE 325 (65 FE) MG PO TABS
325.0000 mg | ORAL_TABLET | Freq: Two times a day (BID) | ORAL | Status: DC
  Administered 2012-12-30 – 2013-01-01 (×5): 325 mg via ORAL
  Filled 2012-12-30 (×5): qty 1

## 2012-12-30 MED ORDER — OXYTOCIN 40 UNITS IN LACTATED RINGERS INFUSION - SIMPLE MED: 62.5000 mL/h | INTRAVENOUS | Status: AC

## 2012-12-30 MED ORDER — TETANUS-DIPHTH-ACELL PERTUSSIS 5-2.5-18.5 LF-MCG/0.5 IM SUSP
0.5000 mL | Freq: Once | INTRAMUSCULAR | Status: AC
  Administered 2012-12-30: 0.5 mL via INTRAMUSCULAR

## 2012-12-30 MED ORDER — DIBUCAINE 1 % RE OINT: 1.0000 "application " | TOPICAL_OINTMENT | RECTAL | Status: DC | PRN

## 2012-12-30 MED ORDER — DIPHENHYDRAMINE HCL 50 MG/ML IJ SOLN: 25.0000 mg | INTRAMUSCULAR | Status: DC | PRN

## 2012-12-30 MED ORDER — PRENATAL MULTIVITAMIN CH
1.0000 | ORAL_TABLET | Freq: Every day | ORAL | Status: DC
  Administered 2012-12-30 – 2013-01-01 (×3): 1 via ORAL
  Filled 2012-12-30 (×2): qty 1

## 2012-12-30 MED ORDER — ONDANSETRON HCL 4 MG PO TABS: 4.0000 mg | ORAL_TABLET | ORAL | Status: DC | PRN

## 2012-12-30 MED ORDER — OXYCODONE-ACETAMINOPHEN 5-325 MG PO TABS
1.0000 | ORAL_TABLET | ORAL | Status: DC | PRN
  Administered 2012-12-30: 1 via ORAL
  Administered 2012-12-30 (×2): 2 via ORAL
  Administered 2012-12-30 – 2012-12-31 (×3): 1 via ORAL
  Administered 2013-01-01 (×2): 2 via ORAL
  Filled 2012-12-30 (×2): qty 2
  Filled 2012-12-30: qty 1
  Filled 2012-12-30 (×2): qty 2
  Filled 2012-12-30 (×3): qty 1

## 2012-12-30 MED ORDER — ONDANSETRON HCL 4 MG/2ML IJ SOLN: 4.0000 mg | Freq: Three times a day (TID) | INTRAMUSCULAR | Status: DC | PRN

## 2012-12-30 MED ORDER — ONDANSETRON HCL 4 MG/2ML IJ SOLN: 4.0000 mg | INTRAMUSCULAR | Status: DC | PRN

## 2012-12-30 MED ORDER — DIPHENHYDRAMINE HCL 50 MG/ML IJ SOLN: 12.5000 mg | INTRAMUSCULAR | Status: DC | PRN

## 2012-12-30 MED ORDER — KETOROLAC TROMETHAMINE 30 MG/ML IJ SOLN: 30.0000 mg | Freq: Four times a day (QID) | INTRAMUSCULAR | Status: AC | PRN

## 2012-12-30 MED ORDER — WITCH HAZEL-GLYCERIN EX PADS: 1.0000 "application " | MEDICATED_PAD | CUTANEOUS | Status: DC | PRN

## 2012-12-30 MED ORDER — SODIUM CHLORIDE 0.9 % IJ SOLN: 3.0000 mL | INTRAMUSCULAR | Status: DC | PRN

## 2012-12-30 MED ORDER — METOCLOPRAMIDE HCL 5 MG/ML IJ SOLN: 10.0000 mg | Freq: Three times a day (TID) | INTRAMUSCULAR | Status: DC | PRN

## 2012-12-30 MED ORDER — MAGNESIUM HYDROXIDE 400 MG/5ML PO SUSP: 30.0000 mL | ORAL | Status: DC | PRN

## 2012-12-30 MED ORDER — NALOXONE HCL 0.4 MG/ML IJ SOLN: 0.4000 mg | INTRAMUSCULAR | Status: DC | PRN

## 2012-12-30 MED ORDER — NALBUPHINE SYRINGE 5 MG/0.5 ML
5.0000 mg | INJECTION | INTRAMUSCULAR | Status: DC | PRN
  Filled 2012-12-30: qty 1

## 2012-12-30 MED ORDER — DIPHENHYDRAMINE HCL 25 MG PO CAPS
25.0000 mg | ORAL_CAPSULE | Freq: Four times a day (QID) | ORAL | Status: DC | PRN
  Administered 2012-12-30: 25 mg via ORAL
  Filled 2012-12-30: qty 1

## 2012-12-30 MED ORDER — LANOLIN HYDROUS EX OINT: 1.0000 "application " | TOPICAL_OINTMENT | CUTANEOUS | Status: DC | PRN

## 2012-12-30 MED ORDER — LACTATED RINGERS IV SOLN
INTRAVENOUS | Status: DC
  Administered 2012-12-30: 01:00:00 via INTRAVENOUS

## 2012-12-30 MED ORDER — ZOLPIDEM TARTRATE 5 MG PO TABS: 5.0000 mg | ORAL_TABLET | Freq: Every evening | ORAL | Status: DC | PRN

## 2012-12-30 MED ORDER — MEASLES, MUMPS & RUBELLA VAC ~~LOC~~ INJ
0.5000 mL | INJECTION | Freq: Once | SUBCUTANEOUS | Status: DC
  Filled 2012-12-30: qty 0.5

## 2012-12-30 NOTE — Progress Notes (Signed)
Subjective: Postpartum Day 1: Cesarean Delivery Patient reports tolerating PO.    Objective: Vital signs in last 24 hours: Temp:  [93.1 F (33.9 C)-99.3 F (37.4 C)] 98.7 F (37.1 C) (07/14 0833) Pulse Rate:  [25-125] 113 (07/14 0833) Resp:  [14-35] 20 (07/14 0833) BP: (70-163)/(29-145) 101/64 mmHg (07/14 0833) SpO2:  [94 %-100 %] 97 % (07/14 0833) Weight:  [288 lb 12.8 oz (130.999 kg)] 288 lb 12.8 oz (130.999 kg) (07/13 1108)  Physical Exam:  General: alert and no distress Lochia: appropriate Uterine Fundus: firm Incision: healing well DVT Evaluation: No evidence of DVT seen on physical exam.   Recent Labs  12/29/12 1332 12/30/12 0635  HGB 12.3 10.3*  HCT 35.7* 30.0*    Assessment/Plan: Status post Cesarean section. Doing well postoperatively.  Continue current care.  Deborra Phegley A 12/30/2012, 8:40 AM

## 2012-12-30 NOTE — Anesthesia Postprocedure Evaluation (Signed)
Anesthesia Post Note  Patient: [name] [name]  Procedure(s) Performed: Procedure(s) (LRB): Primary CESAREAN SECTION  of baby  boy at 90 APGAR 8/9 (N/A)  Anesthesia type: Epidural  Patient location: Mother/Baby  Post pain: Pain level controlled  Post assessment: Post-op Vital signs reviewed  Last Vitals:  Filed Vitals:   12/30/12 0653  BP: 114/71  Pulse: 100  Temp: 37 C  Resp: 20    Post vital signs: Reviewed  Level of consciousness:alert  Complications: No apparent anesthesia complications

## 2012-12-31 NOTE — Lactation Note (Signed)
This note was copied from the chart of [name] [name]. Lactation Consultation Note    Initial consutl  lt with this mom of a term baby, in NICU . Mom began pumping yesterday. I showed her how to hand express, and reviewed teaching about providing breast milk for a NICU baby.  Mom was able to express large drops of colostrum. She is 41 hours post partum. She has large breasts, with erect nipples, and  Compressible tissue. Mom is going ot try and breast feed for the first time today, in the NICU, and I will assist her with latching her baby. Mom  Knows to call for questions/concerns.   Patient Name: [name] Laynee Lockamy [id number] Date: 12/31/2012     Maternal Data    Feeding    LATCH Score/Interventions                      Lactation Tools Discussed/Used     Consult Status      Alfred Levins 12/31/2012, 4:16 PM

## 2012-12-31 NOTE — Lactation Note (Signed)
This note was copied from the chart of [name] [name]. Lactation Consultation Note    Follow up consult with this mom and baby, in  The NICU. i assisted mom with latching her baby, and he latched eagerly and easily, with strong suckles and visible swallows. Mom surprised she had no discomfort, and was smiling. I told her to let the baby feed as long as he wanted, and discussed what a dood latch looked and felt like. I will continue to work with this mom, both while the baby is in the NICU, and after, in o/p lactation, as needed.   Patient Name: [name] Ritha Sampedro [id number] Date: 12/31/2012 Reason for consult: Follow-up assessment;NICU baby   Maternal Data Formula Feeding for Exclusion: Yes (baby in NICU) Reason for exclusion: Mother's choice to formula and breast feed on admission Infant to breast within first hour of birth: No Breastfeeding delayed due to:: Infant status Has patient been taught Hand Expression?: Yes Does the patient have breastfeeding experience prior to this delivery?: No  Feeding Feeding Type: Breast Milk Feeding method: Breast  LATCH Score/Interventions Latch: Grasps breast easily, tongue down, lips flanged, rhythmical sucking.  Audible Swallowing: None Intervention(s): Skin to skin  Type of Nipple: Everted at rest and after stimulation  Comfort (Breast/Nipple): Soft / non-tender     Hold (Positioning): Assistance needed to correctly position infant at breast and maintain latch. Intervention(s): Breastfeeding basics reviewed;Support Pillows;Position options;Skin to skin  LATCH Score: 7  Lactation Tools Discussed/Used     Consult Status Consult Status: Follow-up Date: 01/01/13 Follow-up type: In-patient    Alfred Levins 12/31/2012, 4:20 PM

## 2012-12-31 NOTE — Progress Notes (Signed)
Subjective: Postpartum Day 2: Cesarean Delivery Patient reports tolerating PO, + flatus and no problems voiding.    Objective: Vital signs in last 24 hours: Temp:  [97.3 F (36.3 C)-98.4 F (36.9 C)] 97.3 F (36.3 C) (07/15 0542) Pulse Rate:  [76-79] 79 (07/15 0542) Resp:  [18-20] 18 (07/15 0542) BP: (105-115)/(62-74) 105/62 mmHg (07/15 0542) SpO2:  [98 %] 98 % (07/14 1700)  Physical Exam:  General: alert and no distress Lochia: appropriate Uterine Fundus: firm Incision: healing well DVT Evaluation: No evidence of DVT seen on physical exam.   Recent Labs  12/29/12 1332 12/30/12 0635  HGB 12.3 10.3*  HCT 35.7* 30.0*    Assessment/Plan: Status post Cesarean section. Doing well postoperatively.  Continue current care.  HARPER,CHARLES A 12/31/2012, 8:54 AM

## 2013-01-01 MED ORDER — IBUPROFEN 600 MG PO TABS: 600.0000 mg | ORAL_TABLET | Freq: Four times a day (QID) | ORAL | Status: DC | PRN

## 2013-01-01 MED ORDER — OXYCODONE-ACETAMINOPHEN 5-325 MG PO TABS: 1.0000 | ORAL_TABLET | ORAL | Status: DC | PRN

## 2013-01-01 NOTE — Progress Notes (Signed)
Subjective: Postpartum Day 3: Cesarean Delivery Patient reports tolerating PO, + flatus and no problems voiding.    Objective: Vital signs in last 24 hours: Temp:  [98.4 F (36.9 C)] 98.4 F (36.9 C) (07/15 1718) Pulse Rate:  [92] 92 (07/15 1718) Resp:  [20] 20 (07/15 1718) BP: (135)/(83) 135/83 mmHg (07/15 1718)  Physical Exam:  General: alert and no distress Lochia: appropriate Uterine Fundus: firm Incision: healing well DVT Evaluation: No evidence of DVT seen on physical exam.   Recent Labs  12/29/12 1332 12/30/12 0635  HGB 12.3 10.3*  HCT 35.7* 30.0*    Assessment/Plan: Status post Cesarean section. Doing well postoperatively.  Discharge home with standard precautions and return to clinic in 2 weeks.  Anari Evitt A 01/01/2013, 8:51 AM

## 2013-01-01 NOTE — Progress Notes (Signed)
Clinical Social Work Department PSYCHOSOCIAL ASSESSMENT - MATERNAL/CHILD 01/01/2013  Patient:  [name],[name]  Account Number:  [id number]  Admit Date:  12/29/2012  Childs Name:   [name] [name]    Clinical Social Worker:  Osualdo Hansell, LCSW   Date/Time:  01/01/2013 11:00 AM  Date Referred:  01/01/2013   Referral source  NICU  CN     Referred reason  NICU  Behavioral Health Issues   Other referral source:    I:  FAMILY / HOME ENVIRONMENT Child's legal guardian:  PARENT  Guardian - Name Guardian - Age Guardian - Address  [name] [name] 31 [address] [place], [place] [address]  FOB incarcerated     Other household support members/support persons Name Relationship DOB  [name] [name] DAUGHTER 5   Other support:   MOB states that she has a great support system    II  PSYCHOSOCIAL DATA Information Source:  Patient Interview  Financial and Community Resources Employment:   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   Financial resources:  Private Insurance If Medicaid - County:  [place]  School / Grade:   Maternity Care Coordinator / Child Services Coordination / Early Interventions:   [facility]  Cultural issues impacting care:   None stated    III  STRENGTHS Strengths  Adequate Resources  Compliance with medical plan  Home prepared for Child (including basic supplies)  Supportive family/friends  Understanding of illness   Strength comment:    IV  RISK FACTORS AND CURRENT PROBLEMS Current Problem:  None   Risk Factor & Current Problem Patient Issue Family Issue Risk Factor / Current Problem Comment   N N     V  SOCIAL WORK ASSESSMENT  CSW met with MOB in her first floor room to introduce myself and complete assessment for hx of Anx/Dep and NICU admission.  MOB was pumping when CSW entered and a man and young girl were on the couch behind MOB.  MOB welcomed CSW into the room and said this was a good time to talk.  MOB states she is doing well and seems to have a good understanding of baby's medical  situation.  She acknowledges the potential emotional stress this situation could provoke, but states she knows baby is okay and that she feels she is coping well.  She informed CSW that the man here with her now is the baby's "stepfather."  She states that baby's biological father is incarcerated.  She was adamant that they are no longer together and have no plans of having any contact, however, she did not provide CSW with FOB's name or reason he is incarcerated.  MOB made a comment, which CSW found somewhat strange, that she would never put her children in danger.  CSW thinks she said this in order to ensure that she had no plans to have contact with baby's biological father.  She seemed appropriate and states that she has good supports, all supplies and no barriers to visiting the baby in the hospital now that she is being discharged.  CSW asked MOB about her hx of Anx/Dep.  She states she struggled with depression when she was in high school and her father passed away.  She states no concerns at this time and denies any hx with PPD after first child.  CSW reviewed signs and symptoms to watch for and instructed MOB to call her doctor if symptoms arise.  She agreed.  CSW informed MOB of ongoing support services offered by NICU CSW and gave contact information.    MOB seemed appreciative of CSW's visit.     VI SOCIAL WORK PLAN Social Work Plan  Psychosocial Support/Ongoing Assessment of Needs   Type of pt/family education:   Ongoing support services offered by NICU CSW   If child protective services report - county:   If child protective services report - date:   Information/referral to community resources comment:   [facility]   Other social work plan:    

## 2013-01-01 NOTE — Discharge Summary (Signed)
Obstetric Discharge Summary Reason for Admission: onset of labor Prenatal Procedures: ultrasound Intrapartum Procedures: cesarean: low cervical, transverse Postpartum Procedures: none Complications-Operative and Postpartum: none Hemoglobin  Date Value Range Status  12/30/2012 10.3* 12.0 - 15.0 g/dL Final  1/61/0960 45.4   Final     HCT  Date Value Range Status  12/30/2012 30.0* 36.0 - 46.0 % Final  08/29/2012 36   Final    Physical Exam:  General: alert and no distress Lochia: appropriate Uterine Fundus: firm Incision: healing well DVT Evaluation: No evidence of DVT seen on physical exam.  Discharge Diagnoses: Term Pregnancy-delivered  Discharge Information: Date: 01/01/2013 Activity: pelvic rest Diet: routine Medications: PNV, Ibuprofen, Colace and Percocet Condition: stable Instructions: refer to practice specific booklet Discharge to: home Follow-up Information   Follow up with Antionette Char A, MD. Schedule an appointment as soon as possible for a visit in 2 weeks.   Contact information:   [address] [place] [address] [phone]       Newborn Data: Live born female  Birth Weight: 7 lb 11.1 oz (3490 g) APGAR: 8, 9  Home with mother.  Diondra Pines A 01/01/2013, 8:58 AM

## 2013-01-02 ENCOUNTER — Encounter: Payer: BC Managed Care – PPO | Admitting: Obstetrics & Gynecology

## 2013-01-02 ENCOUNTER — Other Ambulatory Visit: Payer: BC Managed Care – PPO

## 2013-01-09 ENCOUNTER — Encounter: Payer: Self-pay | Admitting: Obstetrics & Gynecology

## 2013-01-09 ENCOUNTER — Ambulatory Visit (INDEPENDENT_AMBULATORY_CARE_PROVIDER_SITE_OTHER): Payer: BC Managed Care – PPO | Admitting: Obstetrics & Gynecology

## 2013-01-09 VITALS — BP 126/85 | HR 70 | Temp 98.3°F

## 2013-01-09 DIAGNOSIS — T8140XA Infection following a procedure, unspecified, initial encounter: Secondary | ICD-10-CM

## 2013-01-09 MED ORDER — OXYCODONE-ACETAMINOPHEN 5-325 MG PO TABS: 1.0000 | ORAL_TABLET | ORAL | Status: DC | PRN

## 2013-01-09 MED ORDER — AMOXICILLIN-POT CLAVULANATE 875-125 MG PO TABS: 1.0000 | ORAL_TABLET | Freq: Two times a day (BID) | ORAL | Status: DC

## 2013-01-09 NOTE — Patient Instructions (Signed)
Wound Infection  A wound infection happens when a type of germ (bacteria) starts growing in the wound. In some cases, this can cause the wound to break open. If cared for properly, the infected wound will heal from the inside to the outside. Wound infections need treatment.  CAUSES  An infection is caused by bacteria growing in the wound.   SYMPTOMS    Increase in redness, swelling, or pain at the wound site.   Increase in drainage at the wound site.   Wound or bandage (dressing) starts to smell bad.   Fever.   Feeling tired or fatigued.   Pus draining from the wound.  TREATMENT   You caregiver will prescribe antibiotic medicine. The wound infection should improve within 24 to 48 hours. Any redness around the wound should stop spreading and the wound should be less painful.   HOME CARE INSTRUCTIONS    Only take over-the-counter or prescription medicines for pain, discomfort, or fever as directed by your caregiver.   Take your antibiotics as directed. Finish them even if you start to feel better.   Gently wash the area with mild soap and water 2 times a day, or as directed. Rinse off the soap. Pat the area dry with a clean towel. Do not rub the wound. This may cause bleeding.   Follow your caregiver's instructions for how often you need to change the dressing.   Apply ointment and a dressing to the wound as directed.   If the dressing sticks, moisten it with soapy water and gently remove it.   Change the bandage right away if it becomes wet, dirty, or develops a bad smell.   Take showers. Do not take tub baths, swim, or do anything that may soak the wound until it is healed.   Avoid exercises that make you sweat heavily.   Use anti-itch medicine as directed by your caregiver. The wound may itch when it is healing. Do not pick or scratch at the wound.   Follow up with your caregiver to get your wound rechecked as directed.  SEEK MEDICAL CARE IF:   You have an increase in swelling, pain, or redness  around the wound.   You have an increase in the amount of pus coming from the wound.   There is a bad smell coming from the wound.   More of the wound breaks open.   You have a fever.  MAKE SURE YOU:    Understand these instructions.   Will watch your condition.   Will get help right away if you are not doing well or get worse.  Document Released: 03/04/2003 Document Revised: 08/28/2011 Document Reviewed: 10/09/2010  ExitCare Patient Information 2014 [employer].

## 2013-01-09 NOTE — Progress Notes (Signed)
Subjective:     [name] [name] is a 31 y.o. female who presents for a postpartum visit. She is 2 weeks postpartum following a low cervical transverse Cesarean section. I have fully reviewed the prenatal and intrapartum course. The delivery was at 39.1 gestational weeks. Outcome: primary cesarean section, low transverse incision. Anesthesia: epidural. Postpartum course has been complicated by a fever for 2 days. . Baby's course has been WNL. Baby is feeding by breast. Bleeding moderate lochia. Bowel function is abnormal: constipation. Bladder function is normal. Patient is not sexually active. Contraception method is none.   The following portions of the patient's history were reviewed and updated as appropriate: allergies, current medications, past family history, past medical history, past social history, past surgical history and problem list.  Review of Systems Pertinent items are noted in HPI.   Objective:    BP 126/85  Pulse 70  Temp(Src) 98.3 F (36.8 C)  LMP 03/30/2012  Breastfeeding? Yes       Incision with area superior/left-sided--indurated.  Intact.  The area was prepped with Betadine.  The skin was infiltrated with 1% lidocaine.  Several mls of purulent fluid were aspirated and sent for C&S.  The overlying skin was opened several centimeters.  The abscess cavity was probed/irrigated/packed. Assessment:   Superficial wound infection  Plan:   Augmentin Wound care Follow up in: 1 week or as needed.

## 2013-01-10 ENCOUNTER — Ambulatory Visit: Payer: BC Managed Care – PPO | Admitting: Obstetrics

## 2013-01-10 ENCOUNTER — Encounter: Payer: Self-pay | Admitting: Obstetrics & Gynecology

## 2013-01-10 ENCOUNTER — Other Ambulatory Visit: Payer: Self-pay | Admitting: *Deleted

## 2013-01-10 DIAGNOSIS — T8131XA Disruption of external operation (surgical) wound, not elsewhere classified, initial encounter: Secondary | ICD-10-CM

## 2013-01-10 NOTE — Progress Notes (Unsigned)
.   Subjective:     [name] [name] is a 32 y.o. female here for her daily dressing change.  No current complaints.  Personal health questionnaire reviewed: {yes/no:9010}.   Gynecologic History Patient's last menstrual period was 03/30/2012. Contraception: none   Obstetric History OB History   Grav Para Term Preterm Abortions TAB SAB Ect Mult Living   4 2 2  2 1 1   2      # Outc Date GA Lbr Len/2nd Wgt Sex Del Anes PTL Lv   1 TRM 7/14 [redacted]w[redacted]d 00:00  M LTCS EPI  Yes   2 TAB            3 SAB            4 TRM                {Common ambulatory SmartLinks:19316}  Review of Systems {ros; complete:30496}    Objective:    {exam; complete:18323}    Assessment:    Healthy female exam.    Plan:    {plan:19193}

## 2013-01-11 ENCOUNTER — Inpatient Hospital Stay (HOSPITAL_COMMUNITY)
Admission: AD | Admit: 2013-01-11 | Discharge: 2013-01-11 | Disposition: A | Discharge status: Home / Self Care | Payer: BC Managed Care – PPO | Source: Ambulatory Visit | Attending: Obstetrics & Gynecology | Admitting: Obstetrics & Gynecology

## 2013-01-11 DIAGNOSIS — Z9889 Other specified postprocedural states: Secondary | ICD-10-CM

## 2013-01-11 DIAGNOSIS — Z98891 History of uterine scar from previous surgery: Secondary | ICD-10-CM

## 2013-01-11 DIAGNOSIS — O909 Complication of the puerperium, unspecified: Secondary | ICD-10-CM | POA: Insufficient documentation

## 2013-01-11 NOTE — MAU Provider Note (Signed)
Ms. [name] [name] is a 31 y.o. [phone] who presents to MAU today for dressing change.   BP 119/72  Pulse 73  Temp(Src) 98 F (36.7 C) (Oral)  Resp 20  LMP 03/30/2012 GENERAL: Well-developed, well-nourished female in no acute distress.  HEENT: Normocephalic, atraumatic.   LUNGS: Effort normal HEART: Regular rate  ABDOMEN: patient has a healing low lateral c-section wound with a 4 cm region of open wound healing. No evidence of surrounding erythema or edema. Minimal purulent discharge noted.  SKIN: Warm, dry and without erythema PSYCH: Normal mood and affect  A: S/p c-section, wound complication  P: Discharge home Patient to return to MAU tomorrow for dressing change and follow-up with MD in the office on Monday Patient may return to MAU sooner as needed  Freddi Starr, PA-C 01/11/2013 4:46 PM

## 2013-01-11 NOTE — MAU Note (Signed)
Here for dressing change. Did have drain in place and this was removed at Dr. Isidore Moos Thursday. To have dressing changed today and tomorrow here and will go to Md office Monday.

## 2013-01-12 ENCOUNTER — Encounter: Payer: Self-pay | Admitting: Obstetrics & Gynecology

## 2013-01-12 LAB — WOUND CULTURE
Gram Stain: NONE SEEN
Gram Stain: NONE SEEN
Organism ID, Bacteria: NO GROWTH

## 2013-01-13 ENCOUNTER — Ambulatory Visit: Payer: BC Managed Care – PPO | Admitting: Obstetrics & Gynecology

## 2013-01-13 NOTE — Progress Notes (Unsigned)
Subjective:     [name] [name] is a 32 y.o. female who presents for a postpartum visit. She is 2 weeks postpartum following a low cervical transverse Cesarean section. I have fully reviewed the prenatal and intrapartum course. The delivery was at 39.1 gestational weeks. Outcome: primary cesarean section, low transverse incision. Anesthesia: epidural. Postpartum course has been WNL. Baby's course has been WNL. Baby is feeding by breast. Bleeding staining only. Bowel function is abnormal: pt states she has not had a bowel movements. Bladder function is normal. Patient is not sexually active. Contraception method is abstinence. Postpartum depression screening: negative.  Pt is also here for a dressing change.  The following portions of the patient's history were reviewed and updated as appropriate: allergies, current medications, past family history, past medical history, past social history, past surgical history and problem list.  Review of Systems {ros; complete:30496}   Objective:    BP 124/83  Pulse 59  Temp(Src) 99.4 F (37.4 C)  Wt 277 lb (125.646 kg)  BMI 44.04 kg/m2  Breastfeeding? Yes  General:  {gen appearance:16600}   Breasts:  {breast exam:1202::"inspection negative, no nipple discharge or bleeding, no masses or nodularity palpable"}  Lungs: {lung exam:16931}  Heart:  {heart exam:5510}  Abdomen: {abdomen exam:16834}   Vulva:  {labia exam:12198}  Vagina: {vagina exam:12200}  Cervix:  {cervix exam:14595}  Corpus: {uterus exam:12215}  Adnexa:  {adnexa exam:12223}  Rectal Exam: {rectal/vaginal exam:12274}        Assessment:    *** postpartum exam. Pap smear {done:10129} at today's visit.   Plan:    1. Contraception: {method:5051} 2. *** 3. Follow up in: {1-10:13787} {time; units:19136} or as needed.

## 2013-01-14 ENCOUNTER — Ambulatory Visit: Payer: BC Managed Care – PPO | Admitting: Obstetrics

## 2013-01-15 ENCOUNTER — Encounter: Payer: Self-pay | Admitting: Obstetrics & Gynecology

## 2013-01-15 ENCOUNTER — Ambulatory Visit (INDEPENDENT_AMBULATORY_CARE_PROVIDER_SITE_OTHER): Payer: BC Managed Care – PPO | Admitting: Obstetrics & Gynecology

## 2013-01-15 VITALS — BP 116/70 | HR 66 | Temp 97.9°F | Wt 272.0 lb

## 2013-01-15 DIAGNOSIS — O8600 Infection of obstetric surgical wound, unspecified: Secondary | ICD-10-CM

## 2013-01-15 DIAGNOSIS — O909 Complication of the puerperium, unspecified: Secondary | ICD-10-CM

## 2013-01-15 MED ORDER — FLUCONAZOLE 150 MG PO TABS: 150.0000 mg | ORAL_TABLET | ORAL | Status: DC

## 2013-01-15 NOTE — Progress Notes (Signed)
Subjective:     [name] [name] is a 32 y.o. female who presents for a postpartum visit. She is 2 weeks postpartum following a low cervical transverse Cesarean section. I have fully reviewed the prenatal and intrapartum course. The delivery was at 39.1 gestational weeks.. Baby's course has been normal. Baby is feeding by both breast and bottle - [employer] Good Start. Bleeding staining only. Bowel function is normal. Bladder function is normal. Patient is not sexually active. Contraception method is abstinence. Postpartum depression screening: negative.  The following portions of the patient's history were reviewed and updated as appropriate: allergies, current medications, past family history, past medical history, past social history, past surgical history and problem list.  Review of Systems Pertinent items are noted in HPI.   Objective:    BP 116/70  Pulse 66  Temp(Src) 97.9 F (36.6 C) (Oral)  Wt 272 lb (123.378 kg)  BMI 43.25 kg/m2  Breastfeeding? Yes       Dressing removed.  Irrigated/packed.  Persistent induration. Assessment:    Superficial SSI  Plan:   Follow up in: 2 days or as needed.

## 2013-01-16 ENCOUNTER — Ambulatory Visit: Payer: BC Managed Care – PPO | Admitting: Obstetrics & Gynecology

## 2013-01-17 ENCOUNTER — Other Ambulatory Visit: Payer: Self-pay | Admitting: *Deleted

## 2013-01-17 ENCOUNTER — Ambulatory Visit (INDEPENDENT_AMBULATORY_CARE_PROVIDER_SITE_OTHER): Payer: BC Managed Care – PPO | Admitting: Advanced Practice Midwife

## 2013-01-17 ENCOUNTER — Encounter: Payer: Self-pay | Admitting: Advanced Practice Midwife

## 2013-01-17 VITALS — BP 133/83 | HR 62 | Temp 98.2°F | Wt 270.0 lb

## 2013-01-17 DIAGNOSIS — O900 Disruption of cesarean delivery wound: Secondary | ICD-10-CM

## 2013-01-17 NOTE — Progress Notes (Signed)
Subjective:     [name] [name] is a 32 y.o. female here for a routine exam.  Current complaints: in office for an incision check.  Personal health questionnaire reviewed: yes.   Gynecologic History No LMP recorded. Contraception: abstinence   Obstetric History OB History   Grav Para Term Preterm Abortions TAB SAB Ect Mult Living   4 2 2  2 1 1   2      # Outc Date GA Lbr Len/2nd Wgt Sex Del Anes PTL Lv   1 TRM 7/14 [redacted]w[redacted]d 00:00  M LTCS EPI  Yes   2 TAB            3 SAB            4 TRM                The following portions of the patient's history were reviewed and updated as appropriate: allergies, current medications, past family history, past medical history, past social history, past surgical history and problem list.  Review of Systems A comprehensive review of systems was negative.    Objective:     Incision without redness, erythema, or discharge. Approximately 3 cm opening of lower transverse c-section site approx 3-4 cm in depth. Area w/ granulated tissue, no evidence of infection.      Assessment:   Patient Active Problem List   Diagnosis Date Noted  . Wound dehiscence, cesarean 01/17/2013  . Sickle cell trait 09/19/2012  . Family history of mental retardation 09/19/2012   S/P c-section    Plan:    Follow up in: 1 day. Patient to continue wound packing until healed. Will f/u at hospital over the weekend. RTC on Monday.

## 2013-01-20 ENCOUNTER — Ambulatory Visit (INDEPENDENT_AMBULATORY_CARE_PROVIDER_SITE_OTHER): Payer: BC Managed Care – PPO | Admitting: Obstetrics & Gynecology

## 2013-01-20 ENCOUNTER — Encounter: Payer: Self-pay | Admitting: Obstetrics & Gynecology

## 2013-01-20 VITALS — BP 134/88 | HR 71 | Temp 98.9°F | Wt 274.0 lb

## 2013-01-20 DIAGNOSIS — O900 Disruption of cesarean delivery wound: Secondary | ICD-10-CM

## 2013-01-20 NOTE — Progress Notes (Signed)
Subjective:     [name] [name] is a 32 y.o. female here for a routine exam.  Current complaints: incision check and dressing change. Pt states everything is going well. Pt states she has no concerns at this time.  Personal health questionnaire reviewed: yes.   Gynecologic History No LMP recorded. Contraception: abstinence   Obstetric History OB History   Grav Para Term Preterm Abortions TAB SAB Ect Mult Living   4 2 2  2 1 1   2      # Outc Date GA Lbr Len/2nd Wgt Sex Del Anes PTL Lv   1 TRM 7/14 [redacted]w[redacted]d 00:00  M LTCS EPI  Yes   2 TAB            3 SAB            4 TRM                The following portions of the patient's history were reviewed and updated as appropriate: allergies, current medications, past family history, past medical history, past social history, past surgical history and problem list.  Review of Systems Pertinent items are noted in HPI.    Objective:   Incision with granulation tissue.  Indurated.  Assessment:   SSI  Plan:   Continue wound care

## 2013-01-22 ENCOUNTER — Ambulatory Visit (INDEPENDENT_AMBULATORY_CARE_PROVIDER_SITE_OTHER): Payer: BC Managed Care – PPO | Admitting: Obstetrics & Gynecology

## 2013-01-22 ENCOUNTER — Encounter: Payer: Self-pay | Admitting: Obstetrics & Gynecology

## 2013-01-22 VITALS — BP 136/88 | HR 62 | Temp 98.3°F | Wt 273.0 lb

## 2013-01-22 DIAGNOSIS — R51 Headache: Secondary | ICD-10-CM

## 2013-01-22 MED ORDER — OXYCODONE-ACETAMINOPHEN 10-325 MG PO TABS: 1.0000 | ORAL_TABLET | Freq: Four times a day (QID) | ORAL | Status: DC | PRN

## 2013-01-22 MED ORDER — BUTALBITAL-APAP-CAFFEINE 50-325-40 MG PO TABS: 1.0000 | ORAL_TABLET | Freq: Four times a day (QID) | ORAL | Status: DC | PRN

## 2013-01-22 NOTE — Progress Notes (Signed)
Subjective:     [name] [name] is a 32 y.o. female here for a routine exam.  Current complaints: incision check. Pt states everything is well with her incision. Pt states she is having daily migraines that are not relieved by ibuprofen. Pt states she is having sensitivity to light.   Personal health questionnaire reviewed: yes.   Gynecologic History No LMP recorded. Contraception: abstinence   Obstetric History OB History   Grav Para Term Preterm Abortions TAB SAB Ect Mult Living   4 2 2  2 1 1   2      # Outc Date GA Lbr Len/2nd Wgt Sex Del Anes PTL Lv   1 TRM 7/14 105w1d 00:00  M LTCS EPI  Yes   2 TAB            3 SAB            4 TRM                The following portions of the patient's history were reviewed and updated as appropriate: allergies, current medications, past family history, past medical history, past social history, past surgical history and problem list.  Review of Systems Pertinent items are noted in HPI.    Objective:    General appearance: alert and no distress Breasts: normal appearance, no masses or tenderness Abdomen: normal findings: soft, non-tender Pelvic: cervix normal in appearance, external genitalia normal, no adnexal masses or tenderness, no cervical motion tenderness, uterus normal size, shape, and consistency and vagina normal without discharge    Assessment:    Healthy female exam.    Plan:    Follow up in: 6 weeks.

## 2013-01-23 ENCOUNTER — Encounter: Payer: Self-pay | Admitting: Obstetrics & Gynecology

## 2013-01-24 ENCOUNTER — Ambulatory Visit (INDEPENDENT_AMBULATORY_CARE_PROVIDER_SITE_OTHER): Payer: BC Managed Care – PPO | Admitting: Advanced Practice Midwife

## 2013-01-24 VITALS — BP 120/76 | HR 56 | Temp 98.9°F | Wt 272.0 lb

## 2013-01-24 DIAGNOSIS — Z5189 Encounter for other specified aftercare: Secondary | ICD-10-CM

## 2013-01-24 NOTE — Progress Notes (Signed)
Subjective:     [name] [name] is a 32 y.o. female here for incision check.  Current complaints: headaches.  Personal health questionnaire reviewed: not asked.   Gynecologic History No LMP recorded. Contraception: NA   Obstetric History OB History   Grav Para Term Preterm Abortions TAB SAB Ect Mult Living   4 2 2  2 1 1   2      # Outc Date GA Lbr Len/2nd Wgt Sex Del Anes PTL Lv   1 TRM 7/14 [redacted]w[redacted]d 00:00  M LTCS EPI  Yes   2 TAB            3 SAB            4 TRM                The following portions of the patient's history were reviewed and updated as appropriate: allergies, current medications, past family history, past medical history, past social history, past surgical history and problem list.  Review of Systems Pertinent items are noted in HPI.    Objective:     Incision clean and dry. No erythema, edema or drainage noted. Edges approximated, no tunneling noted. Incision healing well.      Assessment:    Healthy female exam.    Plan:    Patient to RTC in 1 week for wound check. If stable plan 6 week PP appt.   Wilkin Lippy CNM  15 min spent with patient greater than 80% spent in counseling and coordination of care.

## 2013-01-31 ENCOUNTER — Encounter: Payer: Self-pay | Admitting: Advanced Practice Midwife

## 2013-01-31 ENCOUNTER — Ambulatory Visit (INDEPENDENT_AMBULATORY_CARE_PROVIDER_SITE_OTHER): Payer: BC Managed Care – PPO | Admitting: Advanced Practice Midwife

## 2013-01-31 VITALS — BP 116/75 | HR 73 | Temp 99.1°F | Ht 67.0 in | Wt 269.0 lb

## 2013-01-31 DIAGNOSIS — Z309 Encounter for contraceptive management, unspecified: Secondary | ICD-10-CM

## 2013-01-31 NOTE — Progress Notes (Signed)
Subjective:     [name] [name] is a 32 y.o. female here for a follow up on incision. She c/o greenish yellowish discharge from site. She denies having any pain, otherwise feel incision is healing well. The following portions of the patient's history were reviewed and updated as appropriate: allergies, current medications, past family history, past medical history, past social history, past surgical history and problem list.  Review of Systems A comprehensive review of systems was negative.    Objective:     Incision w/ out erythema, discharge, edema. Small area of granulated tissue exposed. Incision approximated and healing.     Assessment:    Healthy female exam.   Incision healing, no signs of infection. Appropriate candidate for Nexplanon Plan:  Nexplanon education done  Education reviewed: safe sex/STD prevention. Contraception: abstinence. Follow up in: next week for Nexplanon. Unable to insert today because we are waiting for more to arrive. Will call patient to schedule appt. ..    15 min spent with patient greater than 80% spent in counseling and coordination of care.   Amy Wilson Singer CNM

## 2013-02-03 ENCOUNTER — Ambulatory Visit (INDEPENDENT_AMBULATORY_CARE_PROVIDER_SITE_OTHER): Payer: BC Managed Care – PPO | Admitting: Neurology

## 2013-02-03 ENCOUNTER — Encounter: Payer: Self-pay | Admitting: Neurology

## 2013-02-03 VITALS — BP 113/73 | HR 81 | Ht 67.0 in | Wt 274.5 lb

## 2013-02-03 DIAGNOSIS — G43019 Migraine without aura, intractable, without status migrainosus: Secondary | ICD-10-CM

## 2013-02-03 DIAGNOSIS — R51 Headache: Secondary | ICD-10-CM

## 2013-02-03 MED ORDER — TOPIRAMATE 50 MG PO TABS: ORAL_TABLET | ORAL | Status: DC

## 2013-02-03 MED ORDER — RIZATRIPTAN BENZOATE 5 MG PO TABS: 5.0000 mg | ORAL_TABLET | ORAL | Status: DC | PRN

## 2013-02-03 NOTE — Progress Notes (Signed)
Subjective:    Patient ID: [name] [name] is a 32 y.o. female.  HPI  Huston Foley, MD, PhD [facility] [address] [address] [place], [place] [address]  Dear Dr. Tamela Oddi,    I saw your patient, [name] [name], upon your kind request in my neurologic clinic today for initial consultation of her headaches. The patient is accompanied by her 2 children today. As you know, [name] [name] is a very pleasant 32 year old right-handed woman who is recently postpartum on 12/30/2012 and reports having had nearly daily migraine type headaches in the last month. This started gradually, and can be R sided or L sided, or bifrontal. She denies associated rhinorrhea or nasal stuffiness or increased lacrimation. She does have redness of her eyes sometimes. She has a prior diagnosis of migraines who was previously not treated with any prescription medications or if she had prescription medication, she does not recall, as she was a teenager. Since then, she has had infrequent bouts of HAs, which have lasted for weeks at a time. She is currently on as needed Fioricet and Percocet. She tries to take the fioricet at night and the percocet only when the pain is unbearable. She is currently breast feeding, but supplementing with formula and will consider using formula if medications for her HA would help.  She reports a right sided headache or bifrontal, sharp or stabbing pulsating pain, which is described as intermittent and also as a throbbing sensation. There is associated nausea, no vomiting, and there is photophobia and sonophobia. Her headache (HA) frequency is 4 to 5 per week. She has some neck pain, radiating to the back and top of the head. There is family history of migraine on both sides of her family.  The has some Zofran, as needed. The patient denies prior TIA or stroke symptoms, such as sudden onset of one sided weakness, numbness, tingling, slurring of speech or droopy face,  hearing loss, tinnitus, diplopia or visual field cut or monocular loss of vision, and denies recurrent headaches.  Of note, the patient admits to snoring, and there is no report of witnessed apneas or choking sensations while asleep. She is alone with her 2 children, ages 38 years and a one month old and she will go to back to work after 6 weeks, which is in early September. 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 There is associated EDS, but she has significant sleep deprivation as she is alone with the children.  Her Past Medical History Is Significant For: Past Medical History  Diagnosis Date  . Depression   . Anxiety   . Migraine     Her Past Surgical History Is Significant For: Past Surgical History  Procedure Laterality Date  . Cesarean section N/A 12/29/2012    Procedure: Primary CESAREAN SECTION  of baby  boy at 1900 APGAR 8/9;  Surgeon: Antionette Char, MD;  Location: [facility];  Service: Obstetrics;  Laterality: N/A;    Her Family History Is Significant For: Family History  Problem Relation Age of Onset  . Cancer Mother   . Diabetes Mother   . Hypertension Father   . Hypertension      paternal grandparents  . Heart disease Father   . Heart disease Mother     Her Social History Is Significant For: History   Social History  . Marital Status: Single  Spouse Name: N/A    Number of Children: 2  . Years of Education: N/A   Occupational History  .     Social History Main Topics  . Smoking status: Former Smoker -- 0.05 packs/day    Types: Cigarettes    Quit date: 06/19/2002  . Smokeless tobacco: Never Used  . Alcohol Use: No  . Drug Use: No     Comment: patient states that she smoked marijuana before she got pregnant  . Sexual Activity: Not Currently   Other Topics Concern  . None   Social History Narrative   Pt lives at home with family. Currently in school.    Caffeine Use:  rarely    Her Allergies Are:  No Known Allergies:   Her Current Medications Are:  Outpatient Encounter Prescriptions as of 02/03/2013  Medication Sig Dispense Refill  . butalbital-acetaminophen-caffeine (FIORICET) 50-325-40 MG per tablet Take 1-2 tablets by mouth every 6 (six) hours as needed for headache.  40 tablet  2  . ibuprofen (ADVIL,MOTRIN) 600 MG tablet Take 1 tablet (600 mg total) by mouth every 6 (six) hours as needed for pain.  30 tablet  5  . oxyCODONE-acetaminophen (PERCOCET) 10-325 MG per tablet Take 1 tablet by mouth every 6 (six) hours as needed for pain.  40 tablet  0  . Prenatal Vit-Min-FA-Fish Oil ([employer] PRENATAL GUMMY PO) Take 1 each by mouth daily.      . rizatriptan (MAXALT) 5 MG tablet Take 1 tablet (5 mg total) by mouth as needed for migraine. May repeat in 2 hours if needed  10 tablet  2  . topiramate (TOPAMAX) 50 MG tablet 1/2 pill each bedtime x 1 week, then 1 pill nightly x 1 week, then 1-1/2 pills nightly x 1 week, then 2 pills nightly thereafter.  60 tablet  5   No facility-administered encounter medications on file as of 02/03/2013.  :  Review of Systems  Neurological: Positive for headaches.  Psychiatric/Behavioral: Positive for sleep disturbance (not enough sleep).       Decreased energy    Objective:  Neurologic Exam  Physical Exam Physical Examination:   Filed Vitals:   02/03/13 1432  BP: 113/73  Pulse: 81    General Examination: The patient is a very pleasant 32 y.o. female in mild acute distress. Her current pain level is 7/10. She has a R sided HA with photophobia. She appears well-developed and well-nourished and adequately groomed.   HEENT: Normocephalic, atraumatic, pupils are equal, round and reactive to light and accommodation. Funduscopic exam is normal with sharp disc margins noted. Extraocular tracking is good without limitation to gaze excursion or nystagmus noted. Normal smooth pursuit is noted. Hearing is grossly intact. Tympanic  membranes are clear bilaterally. Face is symmetric with normal facial animation and normal facial sensation. Speech is clear with no dysarthria noted. There is no hypophonia. There is no lip, neck/head, jaw or voice tremor. Neck is supple with full range of passive and active motion. There are no carotid bruits on auscultation. Oropharynx exam reveals: mild mouth dryness, adequate dental hygiene and mild airway crowding. Mallampati is class II. Tongue protrudes centrally and palate elevates symmetrically.   Chest: Clear to auscultation without wheezing, rhonchi or crackles noted.  Heart: S1+S2+0, regular and normal without murmurs, rubs or gallops noted.   Abdomen: Soft, non-tender and non-distended with normal bowel sounds appreciated on auscultation.  Extremities: There is no pitting edema in the distal lower extremities bilaterally. Pedal pulses are intact.  Skin:  Warm and dry without trophic changes noted. There are no varicose veins.  Musculoskeletal: exam reveals no obvious joint deformities, tenderness or joint swelling or erythema.   Neurologically:  Mental status: The patient is awake, alert and oriented in all 4 spheres. Her memory, attention, language and knowledge are appropriate. There is no aphasia, agnosia, apraxia or anomia. Speech is clear with normal prosody and enunciation. Thought process is linear. Mood is congruent and affect is normal.  Cranial nerves are as described above under HEENT exam. In addition, shoulder shrug is normal with equal shoulder height noted. Motor exam: Normal bulk, strength and tone is noted. There is no drift, tremor or rebound. Romberg is negative. Reflexes are 2+ throughout. Toes are downgoing bilaterally. Fine motor skills are intact with normal finger taps, normal hand movements, normal rapid alternating patting, normal foot taps and normal foot agility.  Cerebellar testing shows no dysmetria or intention tremor on finger to nose testing. Heel to  shin is unremarkable bilaterally. There is no truncal or gait ataxia.  Sensory exam is intact to light touch, pinprick, vibration, temperature sense and proprioception in the upper and lower extremities.  Gait, station and balance are unremarkable. No veering to one side is noted. No leaning to one side is noted. Posture is age-appropriate and stance is narrow based. No problems turning are noted. She turns en bloc. Tandem walk is unremarkable. Intact toe and heel stance is noted.                Assessment and Plan:   In summary, [name] [name] is a very pleasant 32 y.o.-year old female, recently postpartum, who has been experiencing nearly daily migraine type headaches for the past month to 6 weeks. Her exam is nonfocal. She most likely has migraine without aura currently intractable without status migrainosus. Given her recent pregnancy one has to worry about a more sinister cause for her headache such as a venous sinus thrombosis. I would like to proceed with an MRI of brain, MRA head, and MRV as well. With regards to symptomatic treatment, since she is still breast-feeding were going to be limited with our medication options. However, the patient indicated that she would stop breast-feeding to help her headache severity and frequency at this time as she has in quite a bit of distress often from the head pain. She is already supplementing with formula and has been able to breast-feed her baby for the past month successfully. She is going to see you tomorrow and I have encouraged her to bring up the possibility of trying Topamax for migraine prevention. For abortive treatment I suggested Imitrex, but she would have to wait until gone and the reports are available. I encouraged her to also talk to you about her birth control modality. She is going to have implantable birth control and is advised that Topamax can lower the efficacy of birth control and that she may need a second birth control modality such as  barrier. I have written this down for her so she can bring it up with you at her appointment tomorrow. For now I have asked her not to fill the Topamax or the Imitrex. We talked about potential side effects of these medications as well and that they may not be safe for lactating women. We will schedule her for her MRIs and call her back with test results. I believe one of the biggest players here her in addition to hormonal players are sleep deprivation. She works not only full  time but also goes to school in the evenings. And she is a single mom of a 26-year-old daughter and a one-month-old baby.  I answered all  her  questions today and the patient was in agreement with the above outlined plan. I would like to see the patient back in 3 months, sooner if the need arises and encouraged  her to call with any interim questions, concerns, problems or updates and refill requests.   Thank you very much for allowing me to participate in the care of this nice patient. If I can be of any further assistance to you please do not hesitate to call me at [phone].  Sincerely,   Huston Foley, MD, PhD

## 2013-02-03 NOTE — Patient Instructions (Addendum)
Please remember, common headache triggers are: sleep deprivation, dehydration, overheating, stress, hypoglycemia or skipping meals and blood sugar fluctuations, excessive pain medications or excessive alcohol use or caffeine withdrawal. Some people have food triggers such as aged cheese, orange juice or chocolate, especially dark chocolate, or MSG (monosodium glutamate). Try to avoid these headache triggers as much possible. It may be helpful to keep a headache diary to figure out what makes your headaches worse or brings them on and what alleviates them. Some people report headache onset after exercise but studies have shown that regular exercise may actually prevent headaches from coming. If you have exercise-induced headaches, please make sure that you drink plenty of fluid before and after exercising and that you do not over do it and do not overheat.  Please ask family and friends or your significant other or bed partner if you snore and if so, how loud it is, and if you have breathing related issues in your sleep, such as: snorting sounds, choking sounds, pauses in your breathing or shallow breathing events. These may be symptoms of obstructive sleep apnea (OSA).   I do want to suggest a few things today:  Remember to drink plenty of fluid, eat healthy meals and do not skip any meals. Try to eat protein with a every meal and eat a healthy snack such as fruit or nuts in between meals. Try to keep a regular sleep-wake schedule and try to exercise daily, particularly in the form of walking, 20-30 minutes a day, if you can.   As far as your medications are concerned, I would like to suggest Topamax 50 mg for daily prevention of migraines: Take half a pill each bedtime for one week, then one pill at bedtime daily for one week, then 1-1/2 pills at bedtime daily for one week, then 2 pills at bedtime daily thereafter. Common side effects reported are: Sedation, sleepiness, tingling, change in taste especially  with carbonated drinks, and rare side effects are glaucoma, kidney stones, and problems with thinking including word finding difficulties.  Please talk to Dr. Tamela Oddi about taking Topamax and Maxalt and your new birth control implant, whether or not you may need an additional means of birth control. Also, the topamax may not be safe for the baby, ask your Ob/Gyn about breastfeeding and Topamax.   I will give your prescription for Maxalt. Fill it after your brain scans are done, so we know, it is safe for you to take if for a migraine Attack. It can cause sleepiness, tingling and tightness in your throat.     As far as diagnostic testing: Brain MRI/MRA/MRV  I would like to see you back in 3 months, sooner if we need to. Please call us with any interim questions, concerns, problems, updates or refill requests.  Please also call us for any test results so we can go over those with you on the phone. Brett Canales is my clinical assistant and will answer any of your questions and relay your messages to me and also relay most of my messages to you.  Our phone number is [phone]. We also have an after hours call service for urgent matters and there is a physician on-call for urgent questions. For any emergencies you know to call 911 or go to the nearest emergency room.

## 2013-02-04 ENCOUNTER — Encounter: Payer: Self-pay | Admitting: Obstetrics

## 2013-02-04 ENCOUNTER — Ambulatory Visit (INDEPENDENT_AMBULATORY_CARE_PROVIDER_SITE_OTHER): Payer: BC Managed Care – PPO | Admitting: Obstetrics

## 2013-02-04 VITALS — BP 119/73 | HR 73 | Temp 98.0°F | Wt 267.0 lb

## 2013-02-04 DIAGNOSIS — R519 Headache, unspecified: Secondary | ICD-10-CM

## 2013-02-04 DIAGNOSIS — R51 Headache: Secondary | ICD-10-CM

## 2013-02-04 DIAGNOSIS — Z30017 Encounter for initial prescription of implantable subdermal contraceptive: Secondary | ICD-10-CM

## 2013-02-04 DIAGNOSIS — Z3202 Encounter for pregnancy test, result negative: Secondary | ICD-10-CM

## 2013-02-04 DIAGNOSIS — R52 Pain, unspecified: Secondary | ICD-10-CM

## 2013-02-04 LAB — POCT URINE PREGNANCY: Preg Test, Ur: NEGATIVE

## 2013-02-04 MED ORDER — ETONOGESTREL 68 MG ~~LOC~~ IMPL: 68.0000 mg | DRUG_IMPLANT | Freq: Once | SUBCUTANEOUS | Status: AC

## 2013-02-04 MED ORDER — OXYCODONE-ACETAMINOPHEN 10-325 MG PO TABS: 1.0000 | ORAL_TABLET | Freq: Four times a day (QID) | ORAL | Status: DC | PRN

## 2013-02-04 MED ORDER — IBUPROFEN 600 MG PO TABS: 600.0000 mg | ORAL_TABLET | Freq: Four times a day (QID) | ORAL | Status: DC | PRN

## 2013-02-04 NOTE — Progress Notes (Signed)
NEXPLANON INSERTION NOTE Pt states she is having pain and tenderness in her incision.    Date of LMP:   Recent delivery.   Contraception used: Abstinance  Pregnancy test result: Negative   Indications:  The patient desires contraception.  She understands risks, benefits, and alternatives to Implanon and would like to proceed.  Anesthesia:   Lidocaine 1% plain.  Procedure:  A time-out was performed confirming the procedure and the patient's allergy status.  The patient's non-dominant was identified as the left arm.  The protection cap was removed. While placing countertraction on the skin, the needle was inserted at a 30 degree angle.  The applicator was held horizontal to the skin; the skin was tented upward as the needle was introduced into the subdermal space.  While holding the applicator in place, the slider was unlocked. The Nexplanon was removed from the field.  The Nexplanon was palpated to ensure proper placement.  Complications: None  Instructions:  The patient was instructed to remove the dressing in 24 hours and that some bruising is to be expected.  She was advised to use over the counter analgesics as needed for any pain at the site.  She is to keep the area dry for 24 hours and to call if her hand or arm becomes cold, numb, or blue.  Return visit:  Return in 2 weeks

## 2013-02-06 ENCOUNTER — Ambulatory Visit: Payer: BC Managed Care – PPO | Admitting: Obstetrics

## 2013-02-18 ENCOUNTER — Ambulatory Visit
Admission: RE | Admit: 2013-02-18 | Discharge: 2013-02-18 | Disposition: A | Discharge status: Home / Self Care | Payer: Medicaid Other | Source: Ambulatory Visit | Attending: Neurology | Admitting: Neurology

## 2013-02-18 DIAGNOSIS — R51 Headache: Secondary | ICD-10-CM

## 2013-02-19 ENCOUNTER — Encounter: Payer: BC Managed Care – PPO | Admitting: Obstetrics & Gynecology

## 2013-02-20 ENCOUNTER — Ambulatory Visit: Payer: BC Managed Care – PPO | Admitting: Obstetrics & Gynecology

## 2013-02-20 ENCOUNTER — Other Ambulatory Visit: Payer: Self-pay | Admitting: Diagnostic Neuroimaging

## 2013-02-20 ENCOUNTER — Encounter: Payer: Self-pay | Admitting: *Deleted

## 2013-02-20 NOTE — Progress Notes (Signed)
Quick Note:  Please call and advise the patient that the recent scans we did were within normal limits. We did a brain MRA, MRI and MRV, which showed were normal. In particular, there were no acute findings, such as a stroke, or mass or blood products or clots. No further action is required on this test at this time. Please remind patient to keep any upcoming appointments or tests and to call us with any interim questions, concerns, problems or updates. Thanks,  Huston Foley, MD, PhD   ______

## 2013-02-20 NOTE — Progress Notes (Signed)
Quick Note:  Please call patient and let her know that her MRV showed normal findings. In particular there was no evidence of any clot. This is a study that looks at the veins or venous blood vessels of the brain. Huston Foley, MD, PhD [facility]  ______

## 2013-02-20 NOTE — Progress Notes (Signed)
Quick Note:  I called pt and relayed the results of both MRA and MRV (normal). She asked about the topamax and maxalt prescriptions. I relayed that these had both been filled at [facility]. Pt stated something about being on hold. She stated her Ob-gyn was ok. ______

## 2013-02-20 NOTE — Progress Notes (Unsigned)
Subjective:     [name] [name] is a 32 y.o. female who presents for a postpartum visit. She is 7 weeks postpartum following a low cervical transverse Cesarean section. I have fully reviewed the prenatal and intrapartum course. The delivery was at 39 gestational weeks. Outcome: primary cesarean section, low transverse incision. Anesthesia: spinal. Postpartum course has been WNL. Baby's course has been WNL Baby is feeding by bottle Rush Barer sooth. Bleeding moderate to light. Bowel function is normal. Bladder function is normal. Patient not sexually active. Contraception method is Nexplanon. Postpartum depression screening: negative.  The following portions of the patient's history were reviewed and updated as appropriate: allergies, current medications, past family history, past medical history, past social history, past surgical history and problem list.  Review of Systems {ros; complete:30496}   Objective:    BP 114/80  Pulse 70  Temp(Src) 97.3 F (36.3 C)  Ht 5\' 7"  (1.702 m)  Wt 263 lb (119.296 kg)  BMI 41.18 kg/m2  LMP 02/09/2013  Breastfeeding? No  General:  {gen appearance:16600}   Breasts:  {breast exam:1202::"inspection negative, no nipple discharge or bleeding, no masses or nodularity palpable"}  Lungs: {lung exam:16931}  Heart:  {heart exam:5510}  Abdomen: {abdomen exam:16834}   Vulva:  {labia exam:12198}  Vagina: {vagina exam:12200}  Cervix:  {cervix exam:14595}  Corpus: {uterus exam:12215}  Adnexa:  {adnexa exam:12223}  Rectal Exam: {rectal/vaginal exam:12274}        Assessment:    *** postpartum exam. Pap smear {done:10129} at today's visit.   Plan:    1. Contraception: {method:5051} 2. *** 3. Follow up in: {1-10:13787} {time; units:19136} or as needed.

## 2013-02-21 ENCOUNTER — Telehealth: Payer: Self-pay | Admitting: *Deleted

## 2013-02-21 NOTE — Telephone Encounter (Signed)
Consulted with Dr. Frances Furbish, if pt is nursing her baby and if ok with her ob-gyn to take medications (maxalt and topamax) then she may go ahead.  I LM for pt to return call.  Pt returned call and she is not breastfeeding, and is taking the topamax, but was not able to get maxalt.  MRI's normal. I called [facility] [place] [phone] they will fill this (9 tabs) for $10.00.  This is used as a prn, rescue drug for the onset of migraine.  I LMVM for pt re: this information.  She is to call back if needed.

## 2013-05-29 ENCOUNTER — Telehealth: Payer: Self-pay | Admitting: Neurology

## 2013-05-29 NOTE — Telephone Encounter (Signed)
Attempted to call patient to reschedule 06/16/13 appointment per Dr. Teofilo Pod schedule but all numbers were invalid.

## 2013-06-16 ENCOUNTER — Telehealth: Payer: Self-pay | Admitting: Neurology

## 2013-06-16 ENCOUNTER — Ambulatory Visit: Payer: BC Managed Care – PPO | Admitting: Neurology

## 2013-06-17 NOTE — Telephone Encounter (Signed)
Spoke with patient, scheduled appointment 07/22/12 @ 2:30pm

## 2013-07-09 ENCOUNTER — Emergency Department (INDEPENDENT_AMBULATORY_CARE_PROVIDER_SITE_OTHER): Payer: BC Managed Care – PPO

## 2013-07-09 ENCOUNTER — Emergency Department (HOSPITAL_COMMUNITY)
Admission: EM | Admit: 2013-07-09 | Discharge: 2013-07-09 | Disposition: A | Discharge status: Home / Self Care | Payer: BC Managed Care – PPO | Source: Home / Self Care | Attending: Family Medicine | Admitting: Family Medicine

## 2013-07-09 ENCOUNTER — Encounter (HOSPITAL_COMMUNITY): Payer: Self-pay | Admitting: Emergency Medicine

## 2013-07-09 DIAGNOSIS — L0291 Cutaneous abscess, unspecified: Secondary | ICD-10-CM

## 2013-07-09 DIAGNOSIS — S93409A Sprain of unspecified ligament of unspecified ankle, initial encounter: Secondary | ICD-10-CM

## 2013-07-09 DIAGNOSIS — L0390 Cellulitis, unspecified: Secondary | ICD-10-CM

## 2013-07-09 MED ORDER — DICLOFENAC SODIUM 75 MG PO TBEC: 75.0000 mg | DELAYED_RELEASE_TABLET | Freq: Two times a day (BID) | ORAL | Status: DC | PRN

## 2013-07-09 MED ORDER — SULFAMETHOXAZOLE-TRIMETHOPRIM 800-160 MG PO TABS: 2.0000 | ORAL_TABLET | Freq: Two times a day (BID) | ORAL | Status: DC

## 2013-07-09 MED ORDER — HYDROCODONE-ACETAMINOPHEN 5-325 MG PO TABS: 1.0000 | ORAL_TABLET | Freq: Four times a day (QID) | ORAL | Status: DC | PRN

## 2013-07-09 NOTE — ED Provider Notes (Signed)
[name] [name] is a 33 y.o. female who presents to Urgent Care today for right ankle pain. Patient suffered an inversion injury to her right ankle . She notes pain in the lateral malleolus and at the proximal fifth metatarsal. She has pain on ambulation. She's tried some over-the-counter pain medications which have not help much. She denies any fevers chills nausea vomiting or diarrhea. She denies any significant radiating pain weakness or numbness.  He additionally has an abscess in her right axilla. This is been present for a few days and is worsening. She's tried warm compresses does not help. She gets these frequently. No fevers or chills   Past Medical History  Diagnosis Date  . Depression   . Anxiety   . Migraine    History  Substance Use Topics  . Smoking status: Former Smoker -- 0.05 packs/day    Types: Cigarettes    Quit date: 06/19/2002  . Smokeless tobacco: Never Used  . Alcohol Use: No   ROS as above Medications: Current Facility-Administered Medications  Medication Dose Route Frequency Provider Last Rate Last Dose  . etonogestrel (IMPLANON) implant 68 mg  68 mg Subcutaneous Once Brock Bad, MD       Current Outpatient Prescriptions  Medication Sig Dispense Refill  . diclofenac (VOLTAREN) 75 MG EC tablet Take 1 tablet (75 mg total) by mouth 2 (two) times daily as needed.  60 tablet  0  . HYDROcodone-acetaminophen (NORCO/VICODIN) 5-325 MG per tablet Take 1 tablet by mouth every 6 (six) hours as needed.  15 tablet  0  . rizatriptan (MAXALT) 5 MG tablet Take 1 tablet (5 mg total) by mouth as needed for migraine. May repeat in 2 hours if needed  10 tablet  2  . sulfamethoxazole-trimethoprim (SEPTRA DS) 800-160 MG per tablet Take 2 tablets by mouth 2 (two) times daily.  28 tablet  0  . topiramate (TOPAMAX) 50 MG tablet 1/2 pill each bedtime x 1 week, then 1 pill nightly x 1 week, then 1-1/2 pills nightly x 1 week, then 2 pills nightly thereafter.  60 tablet  5    Exam:   BP 122/82  Pulse 94  Temp(Src) 99 F (37.2 C) (Oral)  Resp 20  SpO2 99%  LMP 06/18/2013  Breastfeeding? No Gen: Well NAD Skin: Large fluctuant abscess right axilla with surrounding erythema induration and tenderness. Right ankle: Swelling at the lateral ankle and tenderness to the lateral malleolus and proximal fifth metatarsal. Pain with motion. Refill sensation and pulses are intact distally.  INCISION AND DRAINAGE Performed by: Clementeen Graham, S Consent: Verbal consent obtained. Risks and benefits: risks, benefits and alternatives were discussed Type: abscess  Body area: Right axilla  Anesthesia: local infiltration  Incision was made with a scalpel.  Local anesthetic: lidocaine 2% with epinephrine  Anesthetic total: 4 ml  Complexity: complex Blunt dissection to break up loculations  Drainage: purulent  Drainage amount: Copious   Packing material: 1/4 in iodoform gauze: 10 inches   Patient tolerance: Patient tolerated the procedure well with no immediate complications.     No results found for this or any previous visit (from the past 24 hour(s)). Dg Ankle Complete Right  07/09/2013   CLINICAL DATA:  Pain post trauma  EXAM: RIGHT ANKLE - COMPLETE 3+ VIEW  COMPARISON:  None.  FINDINGS: Frontal, oblique, and lateral views were obtained. There is no fracture or effusion. Ankle mortise appears intact. No erosive change.  IMPRESSION: No abnormality noted.   Electronically Signed  By: Bretta BangWilliam  Woodruff M.D.   On: 07/09/2013 13:52   Dg Foot Complete Right  07/09/2013   CLINICAL DATA:  Pain post trauma  EXAM: RIGHT FOOT COMPLETE - 3+ VIEW  COMPARISON:  None.  FINDINGS: Frontal, oblique, and lateral views were obtained. There is no fracture or dislocation. Joint spaces appear intact. No erosive change.  IMPRESSION: No abnormality noted.   Electronically Signed   By: Bretta BangWilliam  Woodruff M.D.   On: 07/09/2013 13:47    Assessment and Plan: 33 y.o. female with  1) right ankle  sprain: Plan to use ASO,  Diclofenac and Norco. Will use ice elevation. We'll also use crutches and refer to physical therapy.  Followup with [facility] Kat[facility]ink[facility]azingSmith at [facility] if not improving with physical therapy.   2) right axillary abscess: Large but treated with incision and drainage and packing.  Culture pending.  Will use Septra antibiotics for surrounding cellulitis. Followup in 2-3 days for packing removal and wound recheck.   Discussed warning signs or symptoms. Please see discharge instructions. Patient expresses understanding.    Rodolph BongEvan S Lawanna Cecere, MD 07/09/13 [phone]

## 2013-07-09 NOTE — Discharge Instructions (Signed)
Thank you for coming in today. Return in 2 or 3 days for wound check and packing removal. Taking the antibiotics twice daily.  Use Norco for severe pain. Take diclofenac for regular pain.  Physical therapy should call you soon about your ankle rehabilitation. If you do not hear from them soon please call [phone]   Ankle Sprain An ankle sprain is an injury to the strong, fibrous tissues (ligaments) that hold the bones of your ankle joint together.  CAUSES An ankle sprain is usually caused by a fall or by twisting your ankle. Ankle sprains most commonly occur when you step on the outer edge of your foot, and your ankle turns inward. People who participate in sports are more prone to these types of injuries.  SYMPTOMS   Pain in your ankle. The pain may be present at rest or only when you are trying to stand or walk.  Swelling.  Bruising. Bruising may develop immediately or within 1 to 2 days after your injury.  Difficulty standing or walking, particularly when turning corners or changing directions. DIAGNOSIS  Your caregiver will ask you details about your injury and perform a physical exam of your ankle to determine if you have an ankle sprain. During the physical exam, your caregiver will press on and apply pressure to specific areas of your foot and ankle. Your caregiver will try to move your ankle in certain ways. An X-ray exam may be done to be sure a bone was not broken or a ligament did not separate from one of the bones in your ankle (avulsion fracture).  TREATMENT  Certain types of braces can help stabilize your ankle. Your caregiver can make a recommendation for this. Your caregiver may recommend the use of medicine for pain. If your sprain is severe, your caregiver may refer you to a surgeon who helps to restore function to parts of your skeletal system (orthopedist) or a physical therapist. HOME CARE INSTRUCTIONS   Apply ice to your injury for 1 2 days or as directed by  your caregiver. Applying ice helps to reduce inflammation and pain.  Put ice in a plastic bag.  Place a towel between your skin and the bag.  Leave the ice on for 15-20 minutes at a time, every 2 hours while you are awake.  Only take over-the-counter or prescription medicines for pain, discomfort, or fever as directed by your caregiver.  Elevate your injured ankle above the level of your heart as much as possible for 2 3 days.  If your caregiver recommends crutches, use them as instructed. Gradually put weight on the affected ankle. Continue to use crutches or a cane until you can walk without feeling pain in your ankle.  If you have a plaster splint, wear the splint as directed by your caregiver. Do not rest it on anything harder than a pillow for the first 24 hours. Do not put weight on it. Do not get it wet. You may take it off to take a shower or bath.  You may have been given an elastic bandage to wear around your ankle to provide support. If the elastic bandage is too tight (you have numbness or tingling in your foot or your foot becomes cold and blue), adjust the bandage to make it comfortable.  If you have an air splint, you may blow more air into it or let air out to make it more comfortable. You may take your splint off at night and before taking a shower  or bath. Wiggle your toes in the splint several times per day to decrease swelling. SEEK MEDICAL CARE IF:   You have rapidly increasing bruising or swelling.  Your toes feel extremely cold or you lose feeling in your foot.  Your pain is not relieved with medicine. SEEK IMMEDIATE MEDICAL CARE IF:  Your toes are numb or blue.  You have severe pain that is increasing. MAKE SURE YOU:   Understand these instructions.  Will watch your condition.  Will get help right away if you are not doing well or get worse. Document Released: 06/05/2005 Document Revised: 02/28/2012 Document Reviewed: 06/17/2011 [facility] Patient  Information 2014 [place]ExitCare, MarylandLLC.  Abscess Care After An abscess (also called a boil or furuncle) is an infected area that contains a collection of pus. Signs and symptoms of an abscess include pain, tenderness, redness, or hardness, or you may feel a moveable soft area under your skin. An abscess can occur anywhere in the body. The infection may spread to surrounding tissues causing cellulitis. A cut (incision) by the surgeon was made over your abscess and the pus was drained out. Gauze may have been packed into the space to provide a drain that will allow the cavity to heal from the inside outwards. The boil may be painful for 5 to 7 days. Most people with a boil do not have high fevers. Your abscess, if seen early, may not have localized, and may not have been lanced. If not, another appointment may be required for this if it does not get better on its own or with medications. HOME CARE INSTRUCTIONS   Only take over-the-counter or prescription medicines for pain, discomfort, or fever as directed by your caregiver.  When you bathe, soak and then remove gauze or iodoform packs at least daily or as directed by your caregiver. You may then wash the wound gently with mild soapy water. Repack with gauze or do as your caregiver directs. SEEK IMMEDIATE MEDICAL CARE IF:   You develop increased pain, swelling, redness, drainage, or bleeding in the wound site.  You develop signs of generalized infection including muscle aches, chills, fever, or a general ill feeling.  An oral temperature above 102 F (38.9 C) develops, not controlled by medication. See your caregiver for a recheck if you develop any of the symptoms described above. If medications (antibiotics) were prescribed, take them as directed. Document Released: 12/22/2004 Document Revised: 08/28/2011 Document Reviewed: 08/19/2007 [facility] Patient Information 2014 [place]ExitCare, [place]LLC.

## 2013-07-09 NOTE — ED Notes (Signed)
Dr. Denyse Amassorey is in the room w/pt Pt reports she tripped and fell down 7 steps onto concrete and twisted right ankle She is alert w/no signs of acute distress.

## 2013-07-12 LAB — CULTURE, ROUTINE-ABSCESS: SPECIAL REQUESTS: NORMAL

## 2013-07-13 NOTE — ED Notes (Signed)
Abscess culture R arm: few Staph species (coagulase neg.).  Pt. adequately treated with Septra DS.   Vassie MoselleYork, Nely Dedmon M 07/13/2013

## 2013-07-22 ENCOUNTER — Ambulatory Visit: Payer: Self-pay | Admitting: Neurology

## 2013-08-06 ENCOUNTER — Ambulatory Visit: Payer: BC Managed Care – PPO | Admitting: Obstetrics & Gynecology

## 2013-08-06 DIAGNOSIS — D573 Sickle-cell trait: Secondary | ICD-10-CM

## 2013-09-03 IMAGING — CR DG FINGER THUMB 2+V*R*
1 series · 1 of 1 positions shown · non-contrast
Comparison: None.

CLINICAL DATA: Finger injury

RIGHT THUMB 2+V

[view not recorded]
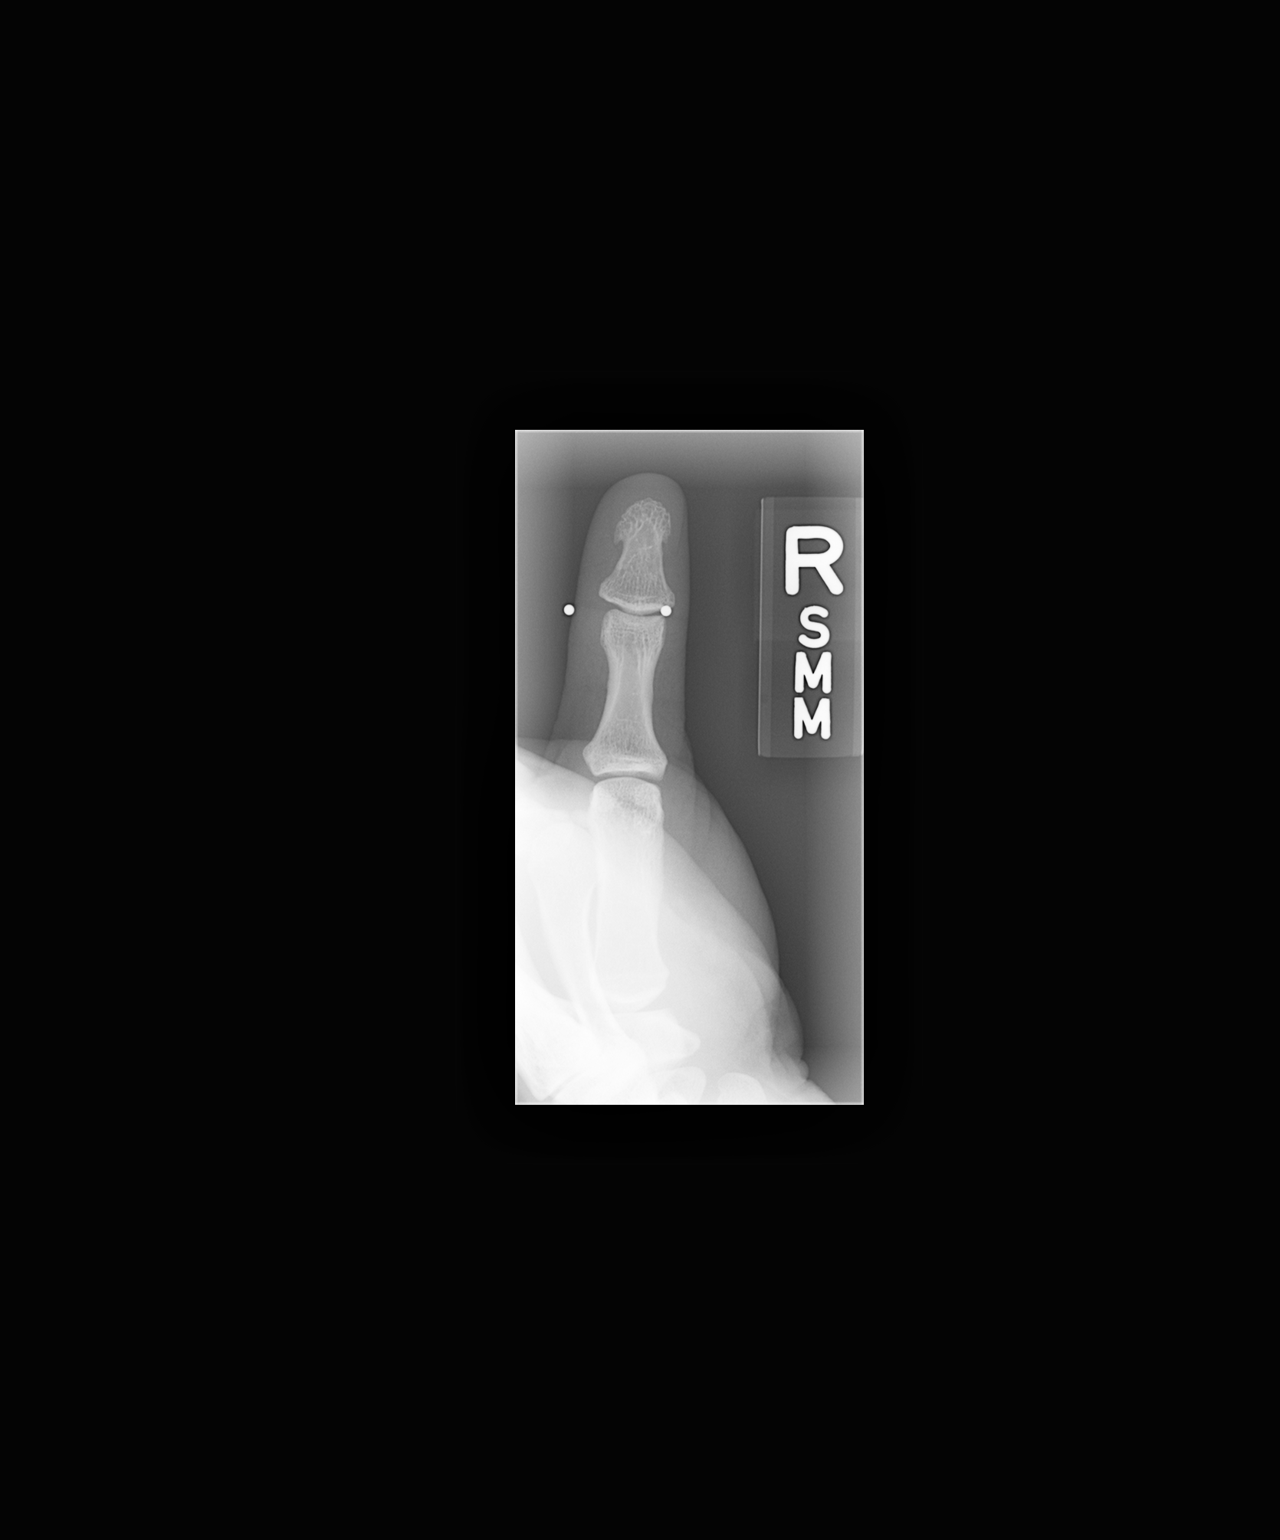

[1 of 1 positions shown; findings below may reference images not displayed]

FINDINGS: Four views of the right thumb submitted.  No acute
fracture or subluxation.
IMPRESSION: No acute fracture or subluxation.

## 2013-10-25 IMAGING — US US OB TRANSVAGINAL
1 series · 14 of 28 positions shown · non-contrast
Comparison: None.

CLINICAL DATA: Pregnant patient with pain.

OBSTETRIC <14 WK US AND TRANSVAGINAL OB US
TECHNIQUE: Both transabdominal and transvaginal ultrasound
examinations were performed for complete evaluation of the
gestation as well as the maternal uterus, adnexal regions, and
pelvic cul-de-sac.  Transvaginal technique was performed to assess
early pregnancy.

[Series 1: us ob transvaginal · 0.23mm/px · 48 acquisitions, 14 frames shown]
[im 2/48]
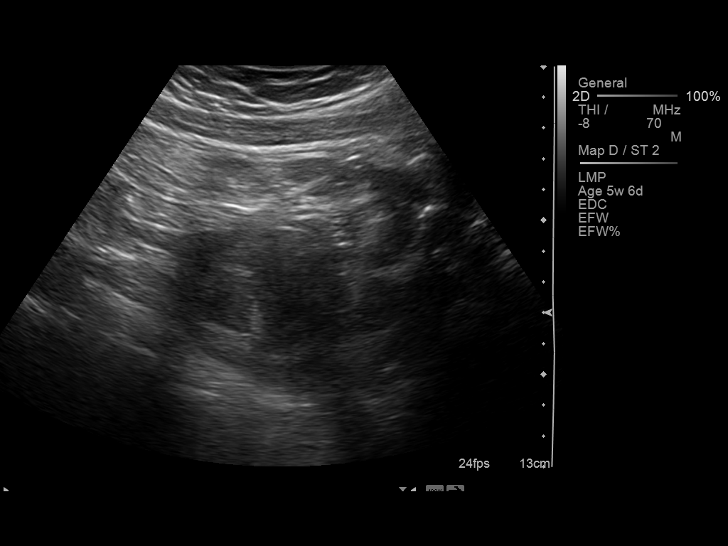
[im 6/48]
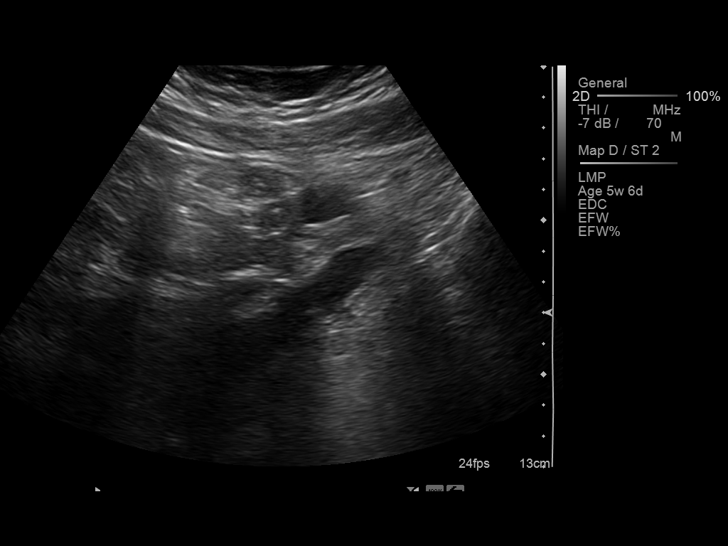
[im 9/48]
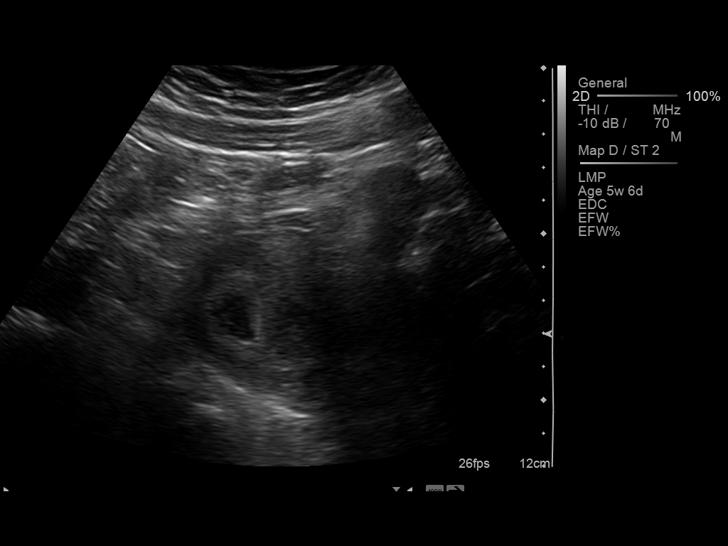
[im 13/48]
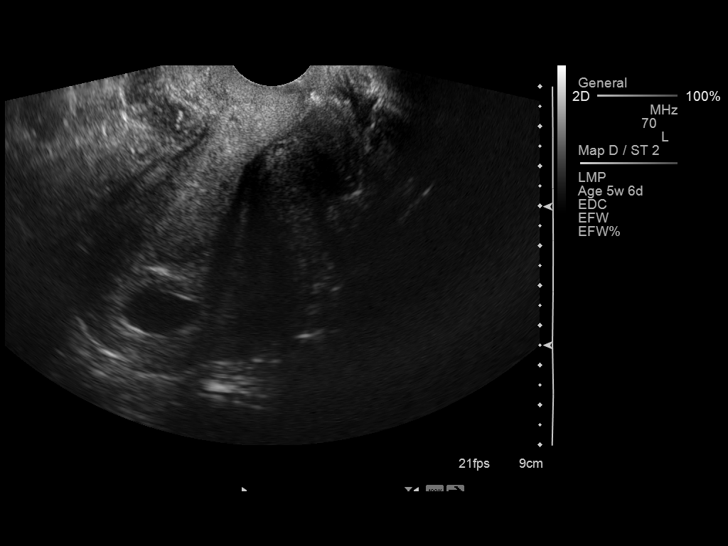
[im 16/48]
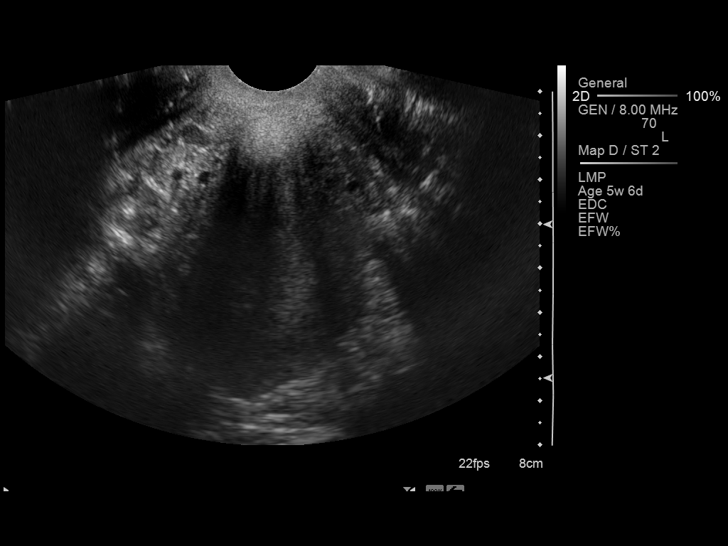
[im 20/48]
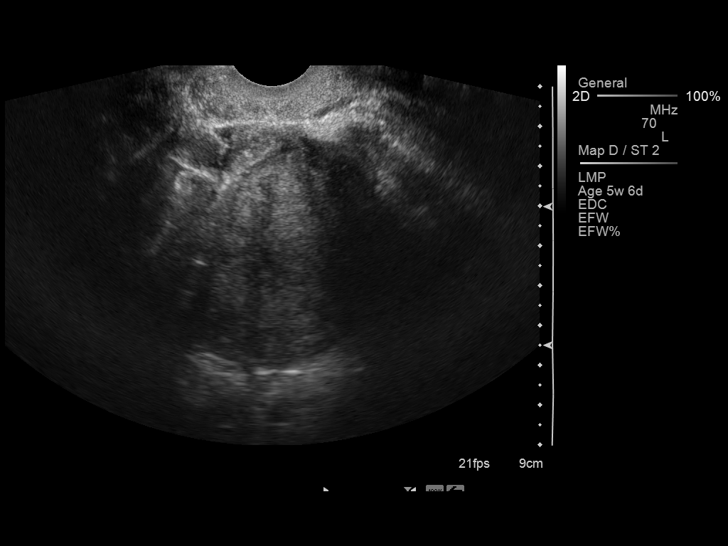
[im 23/48]
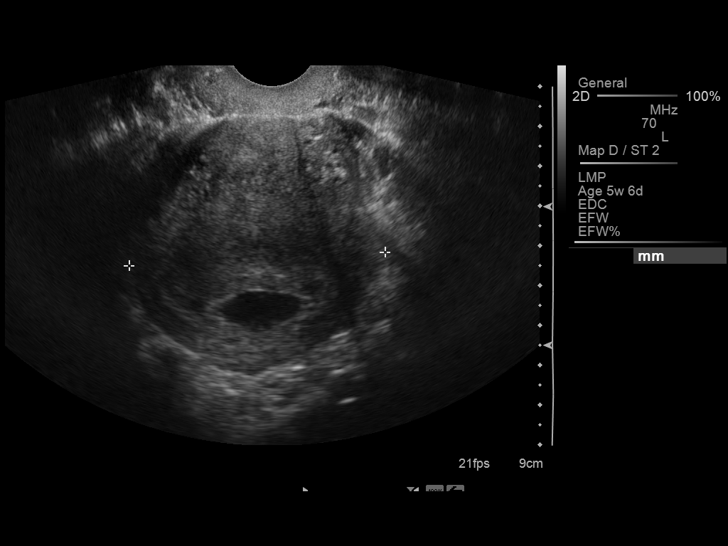
[im 27/48]
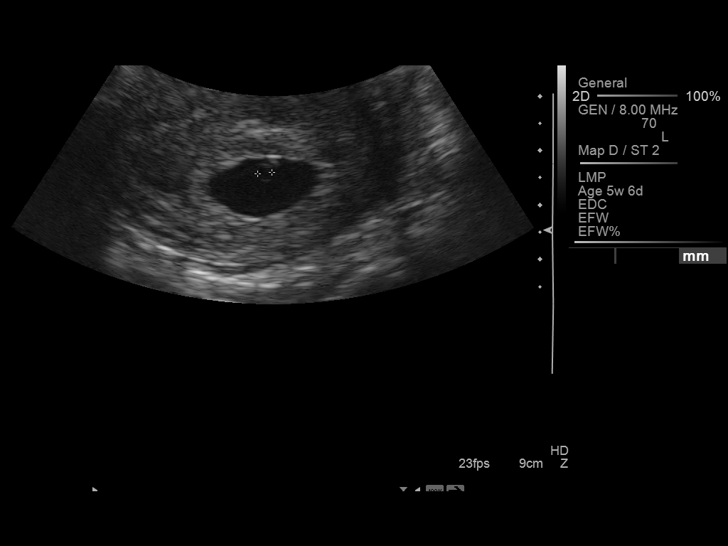
[im 30/48]
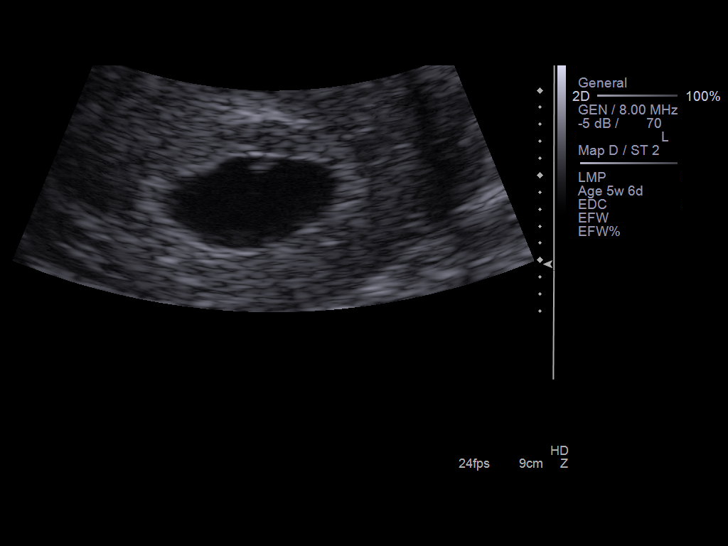
[im 34/48]
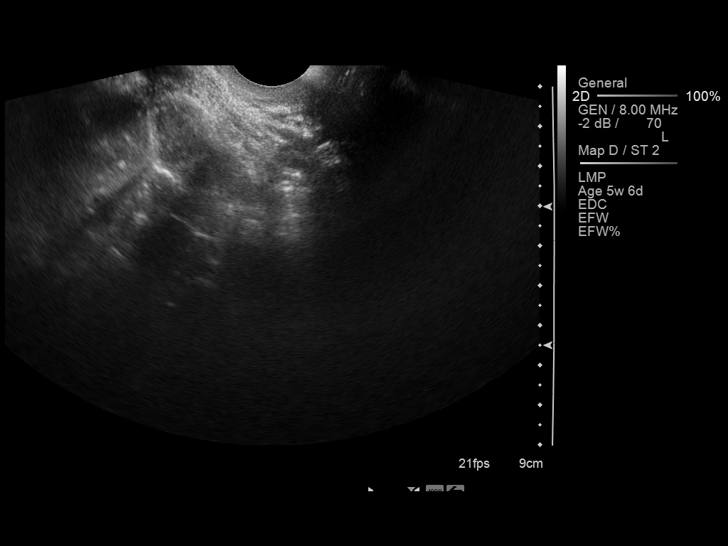
[im 37/48]
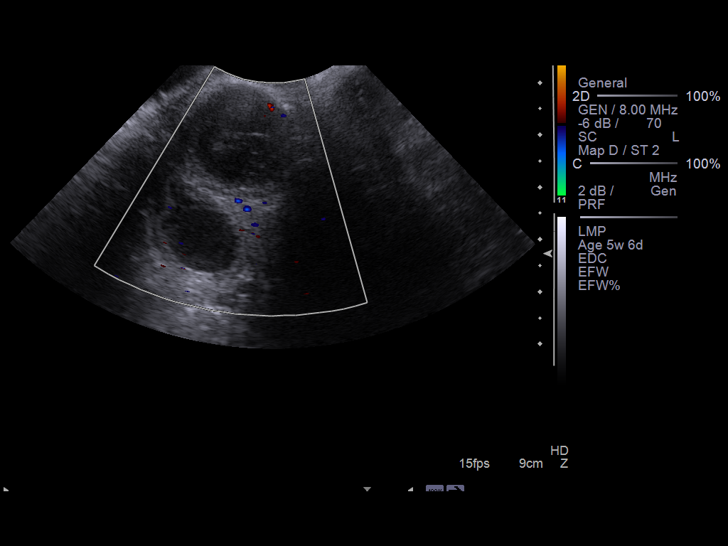
[im 41/48]
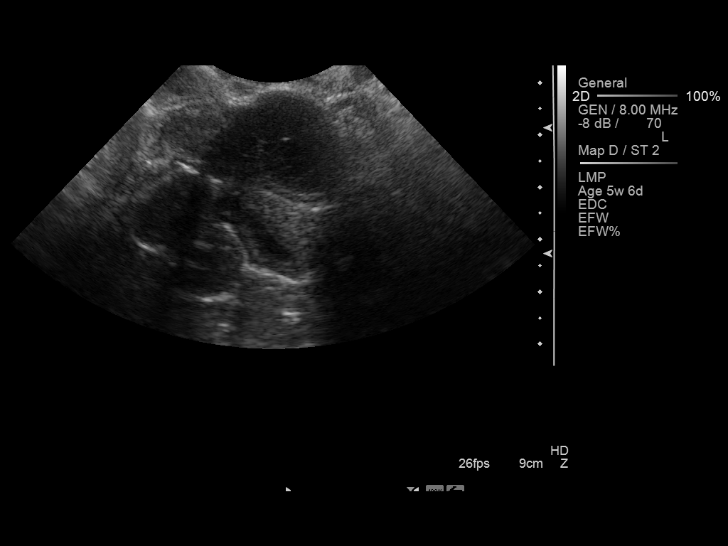
[im 44/48]
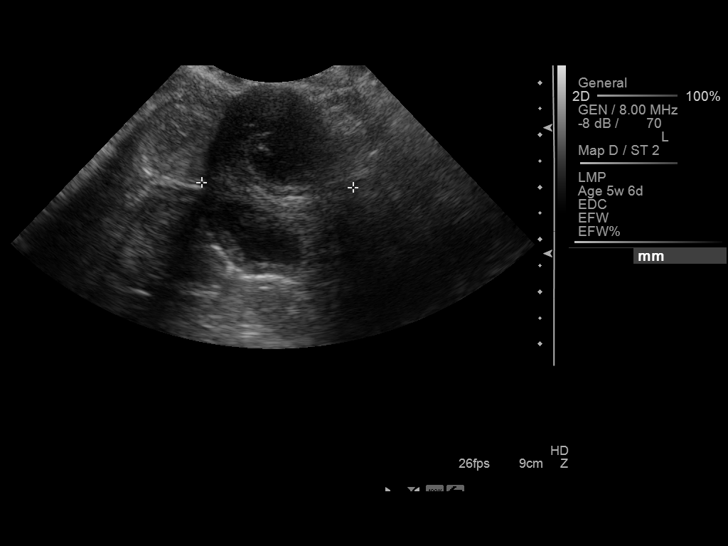
[im 48/48]
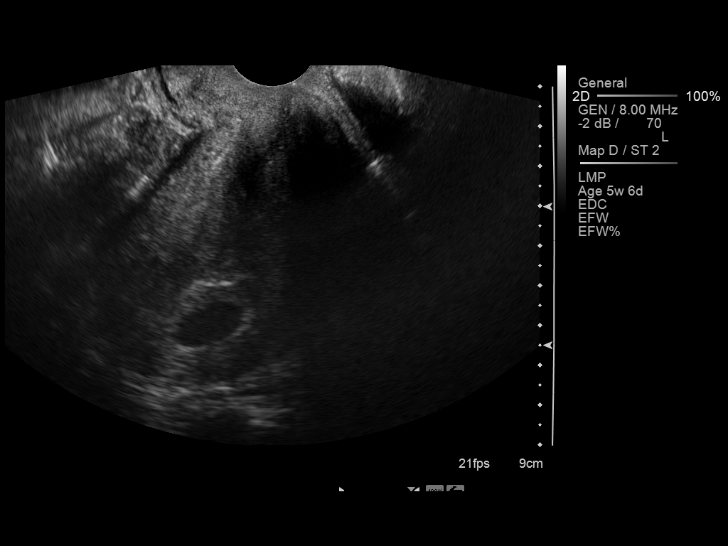

[14 of 28 positions shown; findings below may reference images not displayed]

Intrauterine gestational sac:  Visualized/normal in shape.
Yolk sac: Visualized.
Embryo: Visualized.
Cardiac Activity: Detected.
Heart Rate: 113 bpm

CRL: 25  mm  5 w  6 d         US EDC: 01/04/2013

Maternal uterus/adnexae:
The right ovary is not visualized.  Two cystic structure with
internal echoes are identified in the left ovary each measuring
approximately 1.8 cm. No pelvic fluid is identified.
IMPRESSION: 1.  Single living intrauterine pregnancy without complicating
feature.
2.  Two cystic structures in the right ovary with internal echoes
likely hemorrhagic cysts.  Recommend attention on follow-up
studies.

## 2013-12-09 ENCOUNTER — Telehealth: Payer: Self-pay | Admitting: Neurology

## 2013-12-09 ENCOUNTER — Ambulatory Visit: Payer: BC Managed Care – PPO | Admitting: Neurology

## 2013-12-09 DIAGNOSIS — R519 Headache, unspecified: Secondary | ICD-10-CM

## 2013-12-09 DIAGNOSIS — G43019 Migraine without aura, intractable, without status migrainosus: Secondary | ICD-10-CM

## 2013-12-09 DIAGNOSIS — R51 Headache: Principal | ICD-10-CM

## 2013-12-09 MED ORDER — TOPIRAMATE 50 MG PO TABS: ORAL_TABLET | ORAL | Status: DC

## 2013-12-09 NOTE — Telephone Encounter (Signed)
Pt requesting refill for topiramate (TOPAMAX) 50 MG tablet.  thanks

## 2013-12-09 NOTE — Telephone Encounter (Signed)
Patient has rescheduled appt for Sept.  Refill sent to last until seen.

## 2013-12-10 ENCOUNTER — Encounter: Payer: Self-pay | Admitting: Neurology

## 2014-03-05 ENCOUNTER — Ambulatory Visit: Payer: BC Managed Care – PPO | Admitting: Neurology

## 2014-04-20 ENCOUNTER — Encounter (HOSPITAL_COMMUNITY): Payer: Self-pay | Admitting: Emergency Medicine

## 2014-06-14 ENCOUNTER — Encounter (HOSPITAL_COMMUNITY): Payer: Self-pay | Admitting: Emergency Medicine

## 2014-06-14 ENCOUNTER — Emergency Department (HOSPITAL_COMMUNITY)
Admission: EM | Admit: 2014-06-14 | Discharge: 2014-06-14 | Disposition: A | Discharge status: Home / Self Care | Payer: BC Managed Care – PPO | Source: Home / Self Care | Attending: Family Medicine | Admitting: Family Medicine

## 2014-06-14 DIAGNOSIS — J40 Bronchitis, not specified as acute or chronic: Secondary | ICD-10-CM

## 2014-06-14 DIAGNOSIS — H109 Unspecified conjunctivitis: Secondary | ICD-10-CM

## 2014-06-14 LAB — POCT RAPID STREP A: STREPTOCOCCUS, GROUP A SCREEN (DIRECT): NEGATIVE

## 2014-06-14 MED ORDER — CIPROFLOXACIN HCL 0.3 % OP SOLN: 1.0000 [drp] | OPHTHALMIC | Status: DC

## 2014-06-14 MED ORDER — IPRATROPIUM-ALBUTEROL 0.5-2.5 (3) MG/3ML IN SOLN
3.0000 mL | Freq: Once | RESPIRATORY_TRACT | Status: AC
  Administered 2014-06-14: 3 mL via RESPIRATORY_TRACT

## 2014-06-14 MED ORDER — PREDNISONE 10 MG PO TABS: 30.0000 mg | ORAL_TABLET | Freq: Every day | ORAL | Status: DC

## 2014-06-14 MED ORDER — IPRATROPIUM-ALBUTEROL 0.5-2.5 (3) MG/3ML IN SOLN
RESPIRATORY_TRACT | Status: AC
  Filled 2014-06-14: qty 3

## 2014-06-14 MED ORDER — ALBUTEROL SULFATE HFA 108 (90 BASE) MCG/ACT IN AERS: 2.0000 | INHALATION_SPRAY | Freq: Four times a day (QID) | RESPIRATORY_TRACT | Status: AC | PRN

## 2014-06-14 NOTE — ED Provider Notes (Signed)
[name] is a 33 y.o. female who presents to Urgent Care today for sore throat cough congestion wheezing bilateral pinkeye. Symptoms present for about 3 weeks . She has tried some TheraFlu which did not help much. No vomiting or diarrhea.    Past Medical History  Diagnosis Date  . Depression   . Anxiety   . Migraine    Past Surgical History  Procedure Laterality Date  . Cesarean section N/A 12/29/2012    Procedure: Primary CESAREAN SECTION  of baby  boy at 1900 APGAR 8/9;  Surgeon: Antionette CharLisa Jackson-Moore, MD;  Location: [facility];  Service: Obstetrics;  Laterality: N/A;   History  Substance Use Topics  . Smoking status: Former Smoker -- 0.05 packs/day    Types: Cigarettes    Quit date: 06/19/2002  . Smokeless tobacco: Never Used  . Alcohol Use: No   ROS as above Medications: Current Facility-Administered Medications  Medication Dose Route Frequency Provider Last Rate Last Dose  . etonogestrel (IMPLANON) implant 68 mg  68 mg Subcutaneous Once Brock Badharles A Harper, MD       Current Outpatient Prescriptions  Medication Sig Dispense Refill  . albuterol (PROVENTIL HFA;VENTOLIN HFA) 108 (90 BASE) MCG/ACT inhaler Inhale 2 puffs into the lungs every 6 (six) hours as needed for wheezing or shortness of breath. 1 Inhaler 2  . ciprofloxacin (CILOXAN) 0.3 % ophthalmic solution Place 1 drop into both eyes every 4 (four) hours while awake. 5 mL 0  . predniSONE (DELTASONE) 10 MG tablet Take 3 tablets (30 mg total) by mouth daily. 15 tablet 0  . rizatriptan (MAXALT) 5 MG tablet Take 1 tablet (5 mg total) by mouth as needed for migraine. May repeat in 2 hours if needed 10 tablet 2  . [DISCONTINUED] topiramate (TOPAMAX) 50 MG tablet 1/2 pill each bedtime x 1 week, then 1 pill nightly x 1 week, then 1-1/2 pills nightly x 1 week, then 2 pills nightly thereafter. 60 tablet 2   No Known Allergies   Exam:  BP 107/71 mmHg  Pulse 85  Temp(Src) 98 F (36.7 C) (Oral)  Resp 16  SpO2 96%  LMP  06/04/2014 Gen: Well NAD HEENT: EOMI,  MMM conjunctival injection bilaterally. Patient is wearing contacts. Lungs: Normal work of breathing. Coarse breath sounds throughout Heart: RRR no MRG Abd: NABS, Soft. Nondistended, Nontender Exts: Brisk capillary refill, warm and well perfused.   Patient was given a 2.5/0.5 mg DuoNeb nebulizer treatment and improved  Results for orders placed or performed during the hospital encounter of 06/14/14 (from the past 24 hour(s))  POCT rapid strep A [facility])     Status: None   Collection Time: 06/14/14 11:54 AM  Result Value Ref Range   Streptococcus, Group A Screen (Direct) NEGATIVE NEGATIVE   No results found.  Assessment and Plan: 33 y.o. female with  1) bronchitis. Treatment with prednisone and albuterol. 2) conjunctivitis other viral bacterial. Compensated contacts. Remove contacts and use ciprofloxacin eyedrops.  Discussed warning signs or symptoms. Please see discharge instructions. Patient expresses understanding.     [name] BongEvan S Corey, MD 06/14/14 [phone]

## 2014-06-14 NOTE — ED Notes (Signed)
Pt states that she has had a cough for 3 weeks which has worsened over the past 3 weeks with no relief from theraflu

## 2014-06-14 NOTE — Discharge Instructions (Signed)
Thank you for coming in today. Call or go to the emergency room if you get worse, have trouble breathing, have chest pains, or palpitations.    Conjunctivitis Conjunctivitis is commonly called "pink eye." Conjunctivitis can be caused by bacterial or viral infection, allergies, or injuries. There is usually redness of the lining of the eye, itching, discomfort, and sometimes discharge. There may be deposits of matter along the eyelids. A viral infection usually causes a watery discharge, while a bacterial infection causes a yellowish, thick discharge. Pink eye is very contagious and spreads by direct contact. You may be given antibiotic eyedrops as part of your treatment. Before using your eye medicine, remove all drainage from the eye by washing gently with warm water and cotton balls. Continue to use the medication until you have awakened 2 mornings in a row without discharge from the eye. Do not rub your eye. This increases the irritation and helps spread infection. Use separate towels from other household members. Wash your hands with soap and water before and after touching your eyes. Use cold compresses to reduce pain and sunglasses to relieve irritation from light. Do not wear contact lenses or wear eye makeup until the infection is gone. SEEK MEDICAL CARE IF:   Your symptoms are not better after 3 days of treatment.  You have increased pain or trouble seeing.  The outer eyelids become very red or swollen. Document Released: 07/13/2004 Document Revised: 08/28/2011 Document Reviewed: 06/05/2005 [facility]ExitCare Patient Information 2015 [place]ExitCare, [place]LLC. This information is not intended to replace advice given to you by your health care provider. Make sure you discuss any questions you have with your health care provider.   Acute Bronchitis Bronchitis is inflammation of the airways that extend from the windpipe into the lungs (bronchi). The inflammation often causes mucus to develop. This leads to a  cough, which is the most common symptom of bronchitis.  In acute bronchitis, the condition usually develops suddenly and goes away over time, usually in a couple weeks. Smoking, allergies, and asthma can make bronchitis worse. Repeated episodes of bronchitis may cause further lung problems.  CAUSES Acute bronchitis is most often caused by the same virus that causes a cold. The virus can spread from person to person (contagious) through coughing, sneezing, and touching contaminated objects. SIGNS AND SYMPTOMS   Cough.   Fever.   Coughing up mucus.   Body aches.   Chest congestion.   Chills.   Shortness of breath.   Sore throat.  DIAGNOSIS  Acute bronchitis is usually diagnosed through a physical exam. Your health care provider will also ask you questions about your medical history. Tests, such as chest X-rays, are sometimes done to rule out other conditions.  TREATMENT  Acute bronchitis usually goes away in a couple weeks. Oftentimes, no medical treatment is necessary. Medicines are sometimes given for relief of fever or cough. Antibiotic medicines are usually not needed but may be prescribed in certain situations. In some cases, an inhaler may be recommended to help reduce shortness of breath and control the cough. A cool mist vaporizer may also be used to help thin bronchial secretions and make it easier to clear the chest.  HOME CARE INSTRUCTIONS  Get plenty of rest.   Drink enough fluids to keep your urine clear or pale yellow (unless you have a medical condition that requires fluid restriction). Increasing fluids may help thin your respiratory secretions (sputum) and reduce chest congestion, and it will prevent dehydration.   Take medicines only  as directed by your health care provider.  If you were prescribed an antibiotic medicine, finish it all even if you start to feel better.  Avoid smoking and secondhand smoke. Exposure to cigarette smoke or irritating chemicals  will make bronchitis worse. If you are a smoker, consider using nicotine gum or skin patches to help control withdrawal symptoms. Quitting smoking will help your lungs heal faster.   Reduce the chances of another bout of acute bronchitis by washing your hands frequently, avoiding people with cold symptoms, and trying not to touch your hands to your mouth, nose, or eyes.   Keep all follow-up visits as directed by your health care provider.  SEEK MEDICAL CARE IF: Your symptoms do not improve after 1 week of treatment.  SEEK IMMEDIATE MEDICAL CARE IF:  You develop an increased fever or chills.   You have chest pain.   You have severe shortness of breath.  You have bloody sputum.   You develop dehydration.  You faint or repeatedly feel like you are going to pass out.  You develop repeated vomiting.  You develop a severe headache. MAKE SURE YOU:   Understand these instructions.  Will watch your condition.  Will get help right away if you are not doing well or get worse. Document Released: 07/13/2004 Document Revised: 10/20/2013 Document Reviewed: 11/26/2012 [facility]Exit[facility] Patient Information 2015 [place]ExitCare, [place]LLC. This information is not intended to replace advice given to you by your health care provider. Make sure you discuss any questions you have with your health care provider.

## 2014-06-15 ENCOUNTER — Encounter: Payer: Self-pay | Admitting: *Deleted

## 2014-06-16 LAB — CULTURE, GROUP A STREP

## 2014-07-14 ENCOUNTER — Other Ambulatory Visit (HOSPITAL_COMMUNITY)
Admission: RE | Admit: 2014-07-14 | Discharge: 2014-07-14 | Disposition: A | Discharge status: Home / Self Care | Payer: BLUE CROSS/BLUE SHIELD | Source: Ambulatory Visit | Attending: Family Medicine | Admitting: Family Medicine

## 2014-07-14 ENCOUNTER — Other Ambulatory Visit: Payer: Self-pay | Admitting: Family Medicine

## 2014-07-14 DIAGNOSIS — Z113 Encounter for screening for infections with a predominantly sexual mode of transmission: Secondary | ICD-10-CM | POA: Insufficient documentation

## 2014-07-14 DIAGNOSIS — Z01419 Encounter for gynecological examination (general) (routine) without abnormal findings: Secondary | ICD-10-CM | POA: Diagnosis present

## 2014-07-14 DIAGNOSIS — Z1151 Encounter for screening for human papillomavirus (HPV): Secondary | ICD-10-CM | POA: Insufficient documentation

## 2014-07-15 LAB — CYTOLOGY - PAP

## 2014-12-24 IMAGING — CR DG FOOT COMPLETE 3+V*R*
3 series · 3 of 3 positions shown · non-contrast
Comparison: None.

CLINICAL DATA: Pain post trauma

EXAM:
RIGHT FOOT COMPLETE - 3+ VIEW

[view not recorded (1 of 3)]
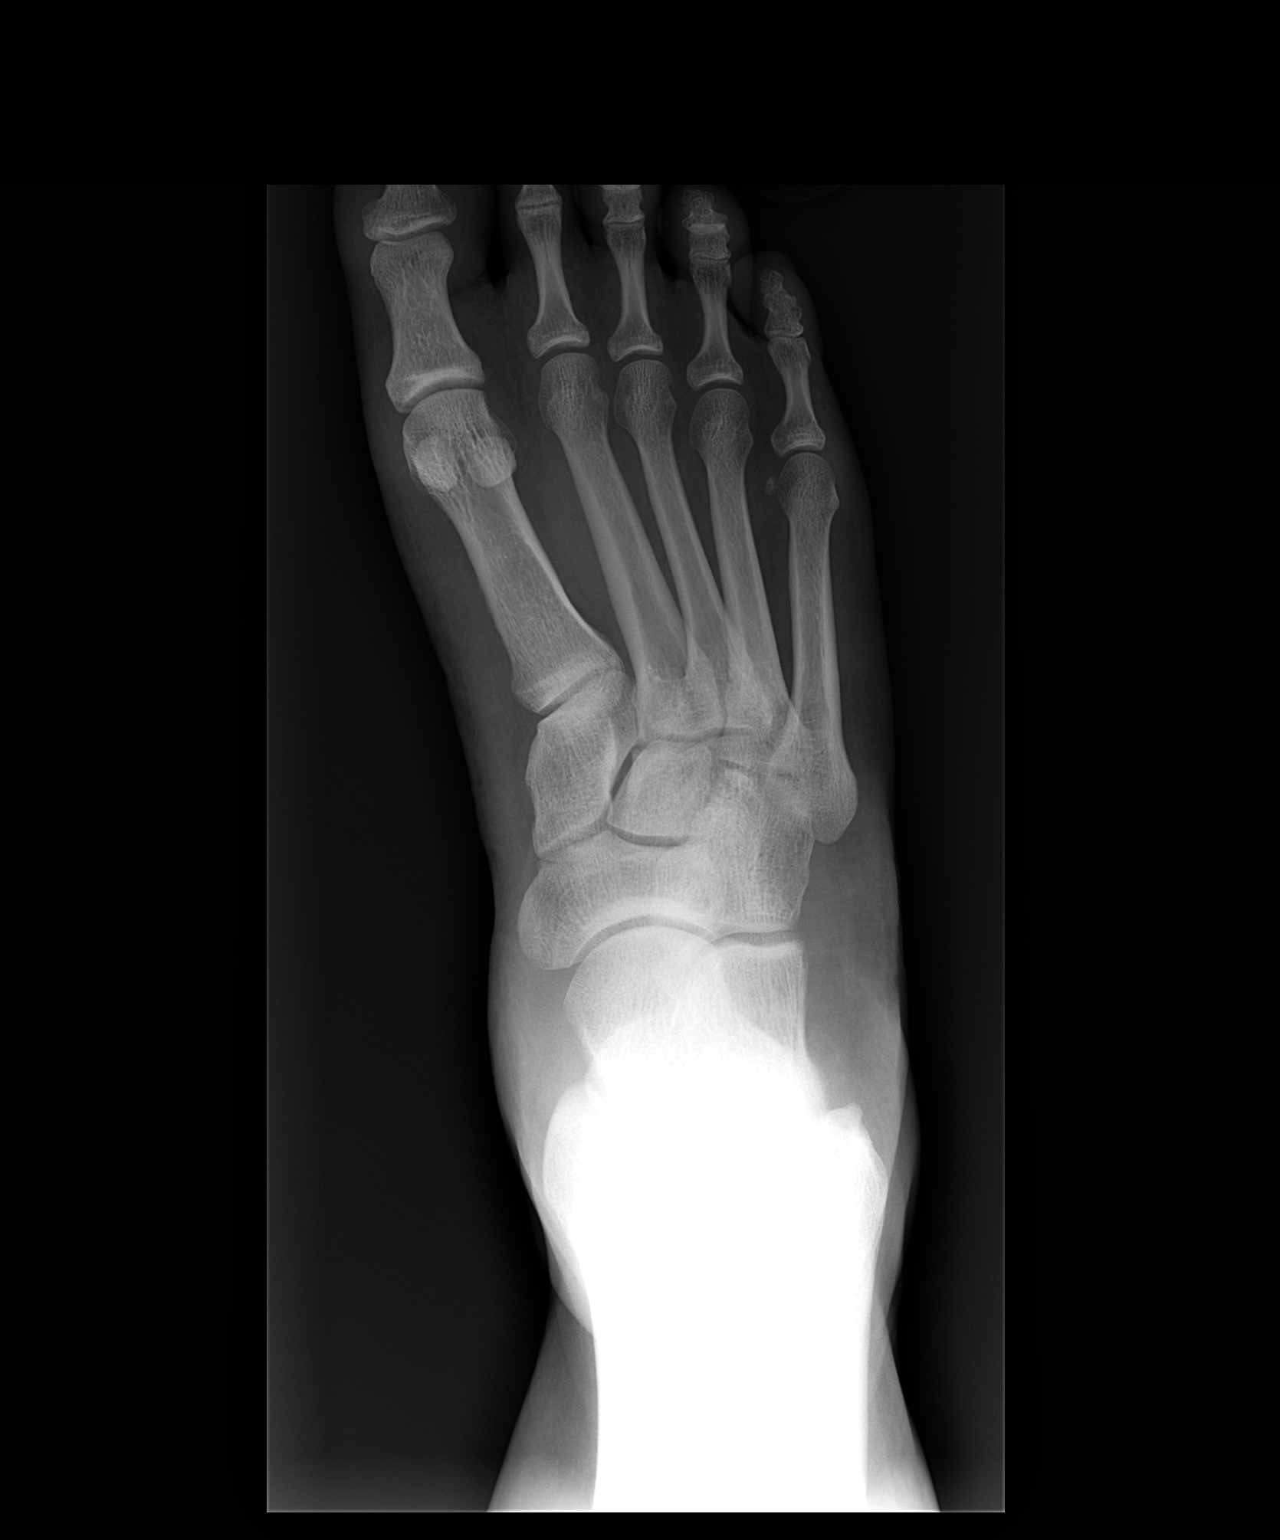

[view not recorded (2 of 3)]
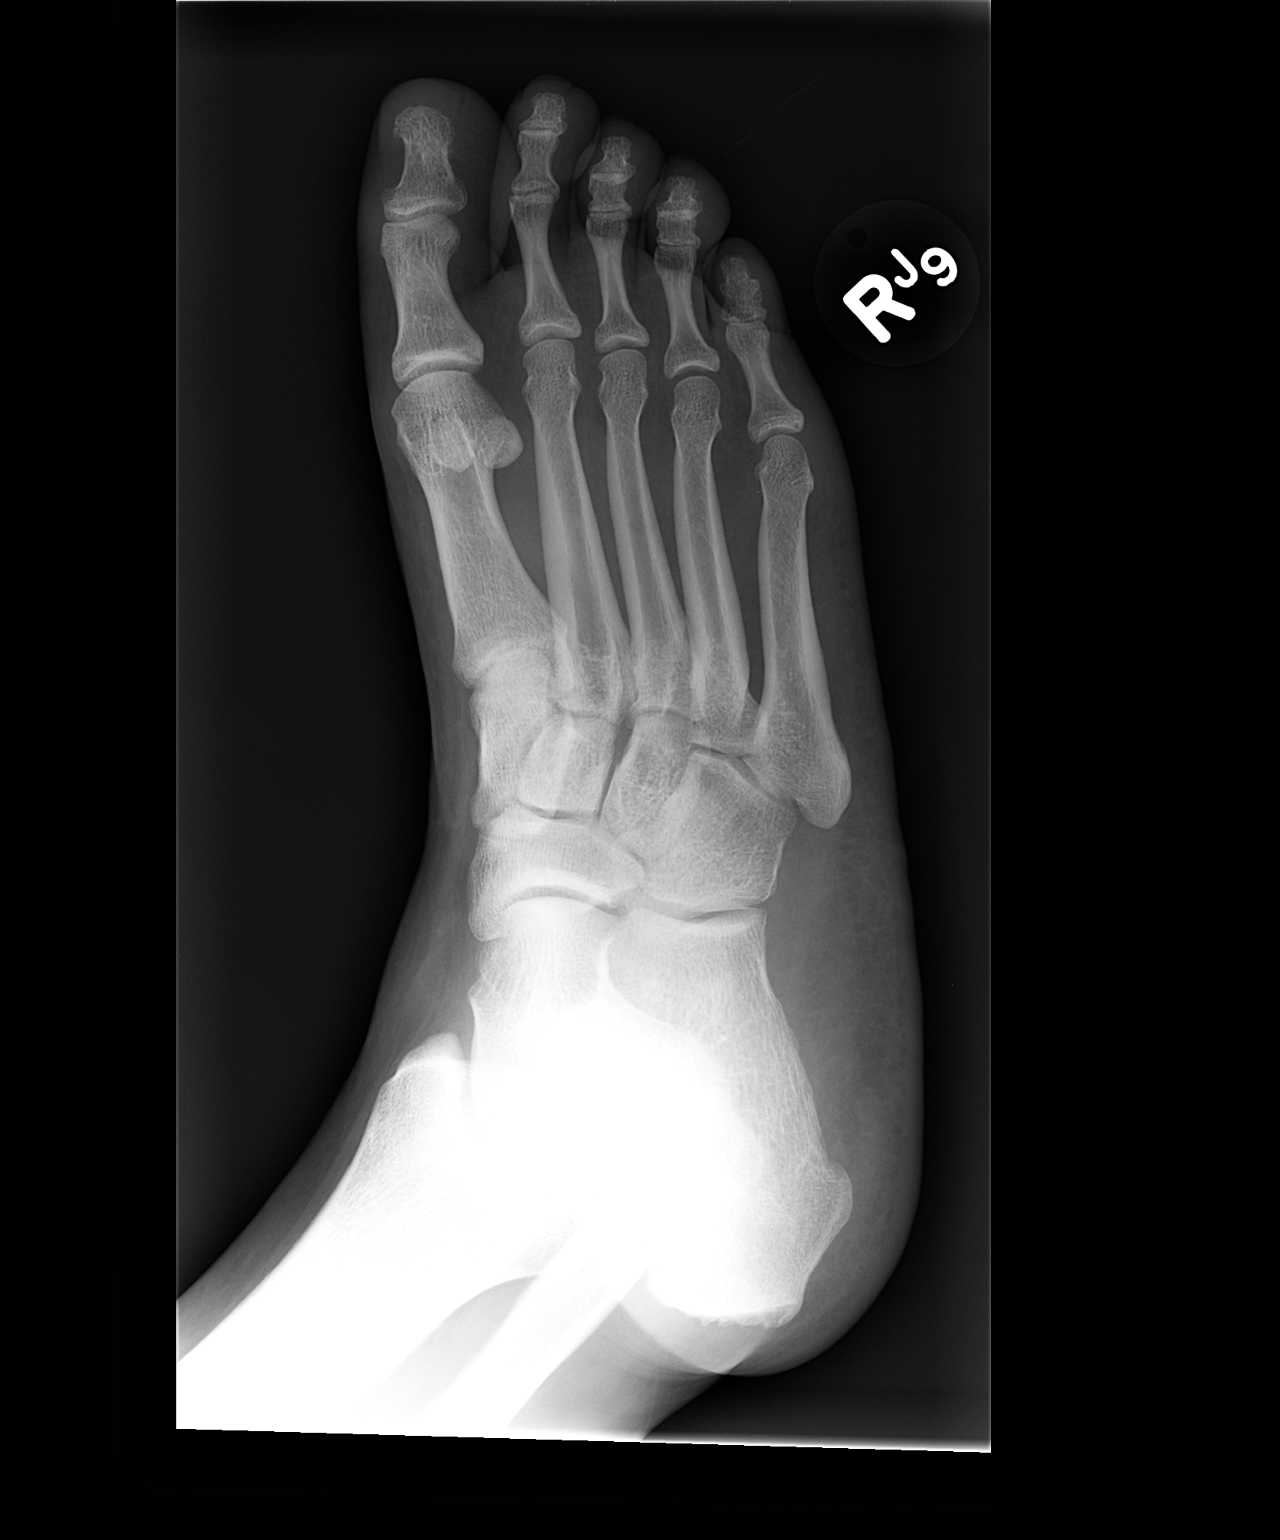

[view not recorded (3 of 3)]
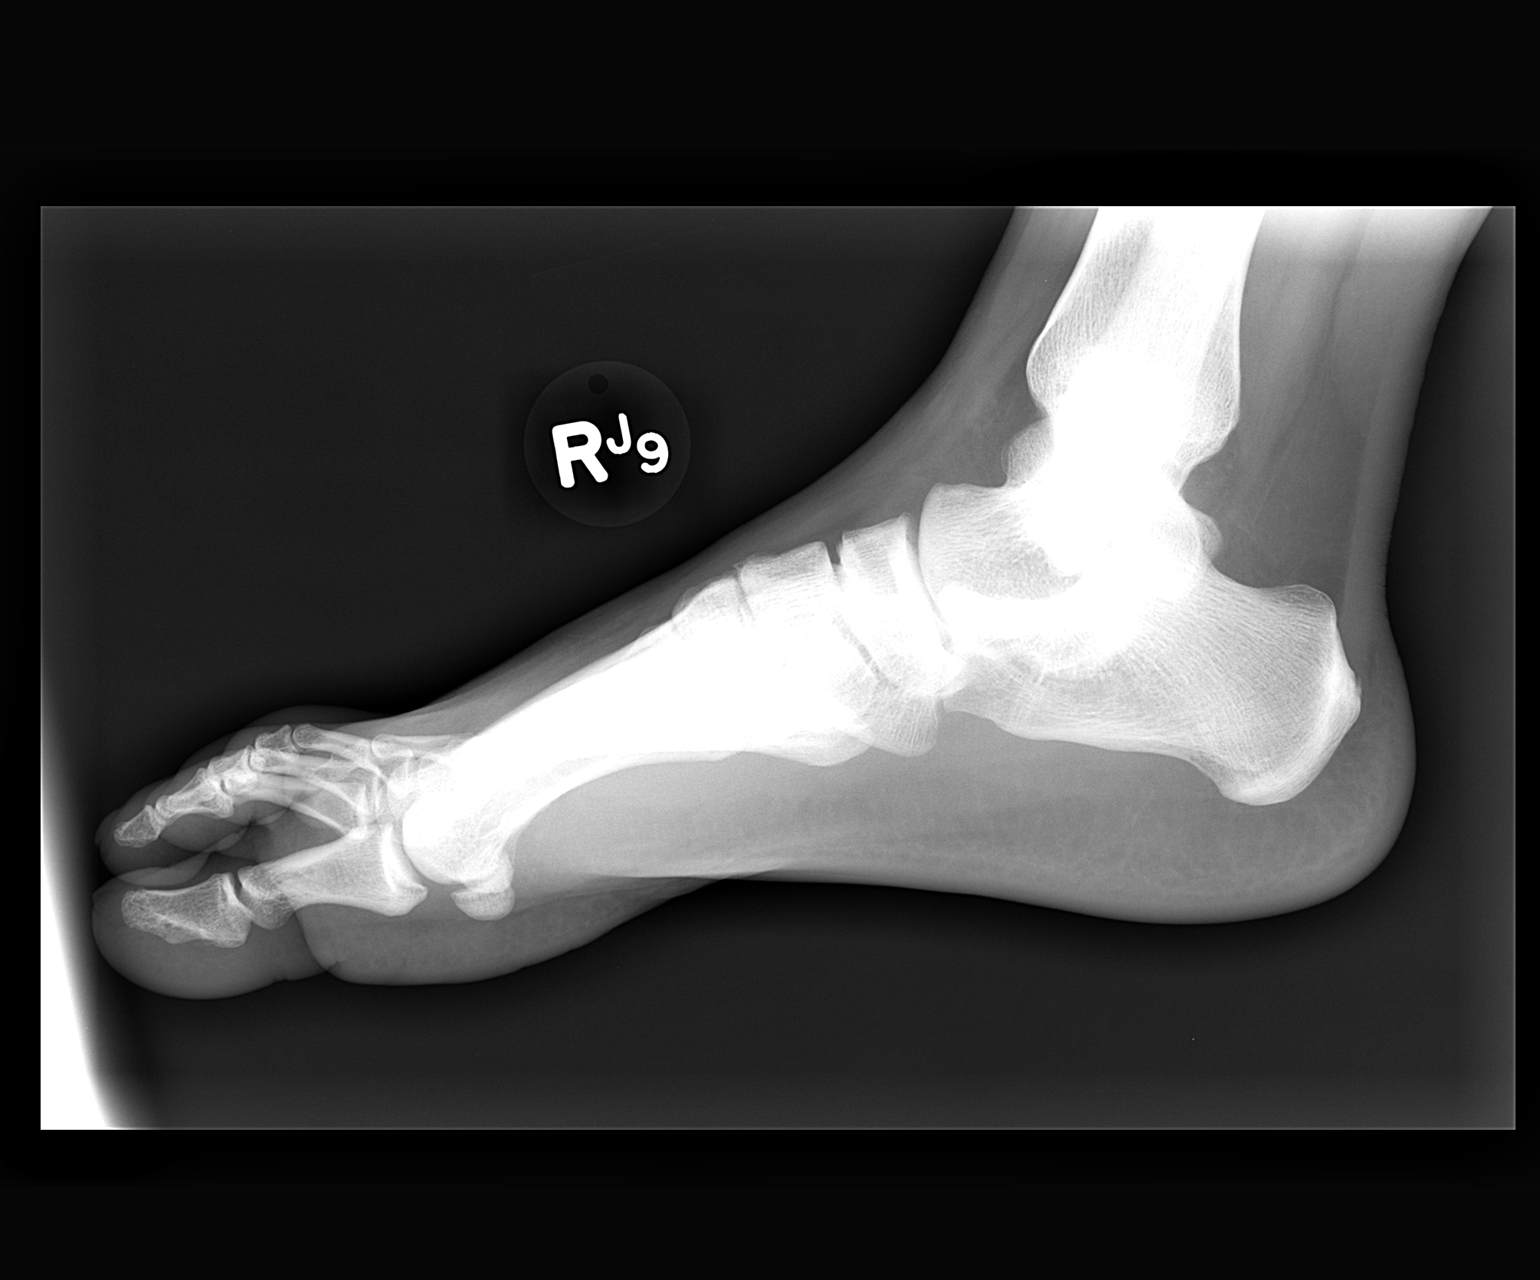

[3 of 3 positions shown; findings below may reference images not displayed]

FINDINGS: Frontal, oblique, and lateral views were obtained. There is no
fracture or dislocation. Joint spaces appear intact. No erosive
change.
IMPRESSION: No abnormality noted.

## 2017-10-04 ENCOUNTER — Ambulatory Visit (INDEPENDENT_AMBULATORY_CARE_PROVIDER_SITE_OTHER): Payer: BLUE CROSS/BLUE SHIELD | Admitting: Obstetrics

## 2017-10-04 ENCOUNTER — Encounter: Payer: Self-pay | Admitting: Obstetrics

## 2017-10-04 ENCOUNTER — Encounter: Payer: Self-pay | Admitting: *Deleted

## 2017-10-04 ENCOUNTER — Other Ambulatory Visit: Payer: Self-pay

## 2017-10-04 VITALS — BP 105/74 | HR 74 | Ht 67.0 in | Wt 133.0 lb

## 2017-10-04 DIAGNOSIS — Z3202 Encounter for pregnancy test, result negative: Secondary | ICD-10-CM | POA: Diagnosis not present

## 2017-10-04 DIAGNOSIS — Z3046 Encounter for surveillance of implantable subdermal contraceptive: Secondary | ICD-10-CM | POA: Diagnosis not present

## 2017-10-04 LAB — POCT URINE PREGNANCY: Preg Test, Ur: NEGATIVE

## 2017-10-04 MED ORDER — ETONOGESTREL 68 MG ~~LOC~~ IMPL
68.0000 mg | DRUG_IMPLANT | Freq: Once | SUBCUTANEOUS | Status: AC
  Administered 2017-10-04: 68 mg via SUBCUTANEOUS

## 2017-10-04 NOTE — Addendum Note (Signed)
Addended by: Maretta BeesMCGLASHAN, Salma Walrond J on: 10/04/2017 11:13 AM   Modules accepted: Orders

## 2017-10-04 NOTE — Progress Notes (Signed)
Presents for removal and insertion of Nexplanon.  UPT today is NEGATIVE.  Informed Consent signed.

## 2017-10-04 NOTE — Progress Notes (Signed)
Nexplanon Procedure Note    PROCEDURE: Nexplanon removal and placement Performing Provider: Brock BadHARLES A. Arrin Pintor MD  Patient education prior to procedure, explained risk, benefits of Nexplanon, reviewed alternative options. Patient reported understanding. Gave consent to continue with procedure.  Patient had Nexplanon inserted in 2014. Desires removal today w/ reinsertion  PROCEDURE:  Pregnancy Text :  not indicated Site (check):      left arm         Sterile Preparation:   Betadinex3 Lot # [id number]   Expiration Date Mar 07 2020    The patient's left arm was palpated and the implant device located. The area was prepped with Betadinex3. The distal end of the device was palpated and 1.5 cc of 1% lidocaine without epinephrine was injected. A 1.5 mm incision was made. Any fibrotic tissue was carefully dissected away using blunt and/or sharp dissection. The device was removed in an intact manner.   Insertion site was ~ 3 cm ventral to the removal site. Nexplanon  was inserted subcutaneously.Needle was removed from the insertion site. Nexplanon capsule was palpated by provider and patient to assure satisfactory placement. Dressing applied.  Followup: The patient tolerated the procedure well without complications.  Standard post-procedure care is explained and return precautions are given.  Brock BadHARLES A. Garnett Nunziata MD 10-04-2017

## 2018-01-10 ENCOUNTER — Other Ambulatory Visit: Payer: Self-pay | Admitting: Physician Assistant

## 2018-01-10 DIAGNOSIS — Z1231 Encounter for screening mammogram for malignant neoplasm of breast: Secondary | ICD-10-CM

## 2018-01-30 ENCOUNTER — Ambulatory Visit
Admission: RE | Admit: 2018-01-30 | Discharge: 2018-01-30 | Disposition: A | Discharge status: Home / Self Care | Payer: BLUE CROSS/BLUE SHIELD | Source: Ambulatory Visit | Attending: Physician Assistant | Admitting: Physician Assistant

## 2018-01-30 DIAGNOSIS — Z1231 Encounter for screening mammogram for malignant neoplasm of breast: Secondary | ICD-10-CM

## 2020-11-09 ENCOUNTER — Ambulatory Visit (INDEPENDENT_AMBULATORY_CARE_PROVIDER_SITE_OTHER): Payer: BLUE CROSS/BLUE SHIELD | Admitting: Obstetrics

## 2020-11-09 ENCOUNTER — Other Ambulatory Visit: Payer: Self-pay

## 2020-11-09 ENCOUNTER — Encounter: Payer: Self-pay | Admitting: Obstetrics

## 2020-11-09 VITALS — BP 123/84 | HR 86 | Ht 66.0 in | Wt 283.5 lb

## 2020-11-09 DIAGNOSIS — Z3046 Encounter for surveillance of implantable subdermal contraceptive: Secondary | ICD-10-CM

## 2020-11-09 DIAGNOSIS — Z3202 Encounter for pregnancy test, result negative: Secondary | ICD-10-CM | POA: Diagnosis not present

## 2020-11-09 LAB — POCT URINE PREGNANCY: Preg Test, Ur: NEGATIVE

## 2020-11-09 MED ORDER — ETONOGESTREL 68 MG ~~LOC~~ IMPL
68.0000 mg | DRUG_IMPLANT | Freq: Once | SUBCUTANEOUS | Status: AC
  Administered 2020-11-09: 68 mg via SUBCUTANEOUS

## 2020-11-09 NOTE — Progress Notes (Signed)
Patient presents for a nexplanon removal and reinsertion.   nexplanon placed 10/04/2017

## 2020-11-09 NOTE — Progress Notes (Signed)
Nexplanon Procedure Note    PROCEDURE: Nexplanon Removal and Reinsertion Performing Provider: Brock Bad MD  Patient education prior to procedure, explained risk, benefits of Nexplanon, reviewed alternative options. Patient reported understanding. Gave consent to continue with procedure.  Patient had Nexplanon inserted in 2019. Desires removal today w/ reinsertion  PROCEDURE:  Pregnancy Text :  not indicated Site (check):      left arm         Sterile Preparation:   Betadinex3 Lot #:  [id number] Expiration Date: 2024APR09    The patient's left arm was palpated and the implant device located. The area was prepped with Betadinex3. The distal end of the device was palpated and 1.5 cc of 1% lidocaine without epinephrine was injected. A 1.5 mm incision was made. Any fibrotic tissue was carefully dissected away using blunt and/or sharp dissection. The device was removed in an intact manner.   Insertion site was the same as the removal site. Nexplanon  was inserted subcutaneously.Needle was removed from the insertion site. Nexplanon capsule was palpated by provider and patient to assure satisfactory placement. Dressing applied.  Followup: The patient tolerated the procedure well without complications.  Standard post-procedure care is explained and return precautions are given.  Brock Bad MD 11/09/20

## 2020-11-23 ENCOUNTER — Ambulatory Visit (INDEPENDENT_AMBULATORY_CARE_PROVIDER_SITE_OTHER): Payer: BLUE CROSS/BLUE SHIELD | Admitting: Obstetrics

## 2020-11-23 ENCOUNTER — Other Ambulatory Visit: Payer: Self-pay

## 2020-11-23 ENCOUNTER — Encounter: Payer: Self-pay | Admitting: Obstetrics

## 2020-11-23 DIAGNOSIS — Z3046 Encounter for surveillance of implantable subdermal contraceptive: Secondary | ICD-10-CM | POA: Diagnosis not present

## 2020-11-23 NOTE — Progress Notes (Signed)
GYN presents for FU after Nexplanon Insertion.

## 2020-11-23 NOTE — Progress Notes (Signed)
Subjective:    [name] [name] is a 40 y.o. female who presents for contraception counseling. The patient has no complaints today. The patient is sexually active. Pertinent past medical history: migraines without aura.  The information documented in the HPI was reviewed and verified.  Menstrual History: OB History    Gravida  4   Para  2   Term  2   Preterm      AB  2   Living  2     SAB  1   IAB  1   Ectopic      Multiple      Live Births  1           Menarche age:  No LMP recorded. Patient has had an implant.   Patient Active Problem List   Diagnosis Date Noted  . Sickle cell trait (HCC) 09/19/2012  . Family history of mental retardation 09/19/2012   Past Medical History:  Diagnosis Date  . Anxiety   . Depression   . Migraine     Past Surgical History:  Procedure Laterality Date  . CESAREAN SECTION N/A 12/29/2012   Procedure: Primary CESAREAN SECTION  of baby  boy at 1900 APGAR 8/9;  Surgeon: Antionette Char, MD;  Location: [facility];  Service: Obstetrics;  Laterality: N/A;     Current Outpatient Medications:  .  albuterol (PROVENTIL HFA;VENTOLIN HFA) 108 (90 BASE) MCG/ACT inhaler, Inhale 2 puffs into the lungs every 6 (six) hours as needed for wheezing or shortness of breath., Disp: 1 Inhaler, Rfl: 2 .  omeprazole (PRILOSEC OTC) 20 MG tablet, Take by mouth., Disp: , Rfl:   Current Facility-Administered Medications:  .  etonogestrel (IMPLANON) implant 68 mg, 68 mg, Subcutaneous, Once, Brock Bad, MD No Known Allergies  Social History   Tobacco Use  . Smoking status: Former Smoker    Packs/day: 0.05    Types: Cigarettes  . Smokeless tobacco: Never Used  Substance Use Topics  . Alcohol use: Yes    Comment: social    Family History  Problem Relation Age of Onset  . Cancer Mother   . Diabetes Mother   . Heart disease Mother   . Breast cancer Mother 30  . Hypertension Father   . Heart disease Father   . Hypertension Other         paternal grandparents  . Breast cancer Maternal Grandmother 60       Review of Systems Constitutional: negative for weight loss Genitourinary:negative for abnormal menstrual periods and vaginal discharge   Objective:   There were no vitals taken for this visit.   General:   alert and no distress  Skin:   no rash or abnormalities  Lungs:   clear to auscultation bilaterally  Heart:   regular rate and rhythm, S1, S2 normal, no murmur, click, rub or gallop              Left Upper Extremity:  Nexplanon removal/insertion site is clean dry, intact.  Rod palpated and is intact, non tender    Lab Review Urine pregnancy test Labs reviewed no Radiologic studies reviewed no  I have spent a total of 15 minutes of face-to-face time, excluding clinical staff time, reviewing notes and preparing to see patient, ordering tests and/or medications, and counseling the patient.   Assessment:    40 y.o., continuing Nexplanon, no contraindications.   Plan:    All questions answered. Contraception: Nexplanon. Discussed healthy lifestyle modifications. Follow up in 3 months.  Annual / Pap    Brock Bad, MD 11/23/2020 4:50 PM

## 2022-11-30 DIAGNOSIS — R030 Elevated blood-pressure reading, without diagnosis of hypertension: Secondary | ICD-10-CM | POA: Diagnosis not present

## 2022-11-30 DIAGNOSIS — Z0000 Encounter for general adult medical examination without abnormal findings: Secondary | ICD-10-CM | POA: Diagnosis not present

## 2022-11-30 DIAGNOSIS — Z23 Encounter for immunization: Secondary | ICD-10-CM | POA: Diagnosis not present

## 2022-11-30 DIAGNOSIS — Z113 Encounter for screening for infections with a predominantly sexual mode of transmission: Secondary | ICD-10-CM | POA: Diagnosis not present

## 2022-11-30 DIAGNOSIS — F419 Anxiety disorder, unspecified: Secondary | ICD-10-CM | POA: Diagnosis not present

## 2022-11-30 DIAGNOSIS — R7301 Impaired fasting glucose: Secondary | ICD-10-CM | POA: Diagnosis not present

## 2023-09-21 ENCOUNTER — Other Ambulatory Visit: Payer: Self-pay | Admitting: Family Medicine

## 2023-09-21 DIAGNOSIS — Z1231 Encounter for screening mammogram for malignant neoplasm of breast: Secondary | ICD-10-CM

## 2023-10-01 ENCOUNTER — Other Ambulatory Visit: Payer: Self-pay | Admitting: Family Medicine

## 2023-10-01 DIAGNOSIS — N6002 Solitary cyst of left breast: Secondary | ICD-10-CM

## 2023-11-08 ENCOUNTER — Ambulatory Visit

## 2023-11-08 ENCOUNTER — Other Ambulatory Visit: Payer: Self-pay | Admitting: Family Medicine

## 2023-11-08 ENCOUNTER — Ambulatory Visit
Admission: RE | Admit: 2023-11-08 | Discharge: 2023-11-08 | Disposition: A | Discharge status: Home / Self Care | Source: Ambulatory Visit | Attending: Family Medicine | Admitting: Family Medicine

## 2023-11-08 DIAGNOSIS — N6002 Solitary cyst of left breast: Secondary | ICD-10-CM

## 2024-01-31 ENCOUNTER — Encounter (HOSPITAL_COMMUNITY): Payer: Self-pay

## 2024-01-31 ENCOUNTER — Ambulatory Visit (INDEPENDENT_AMBULATORY_CARE_PROVIDER_SITE_OTHER)

## 2024-01-31 ENCOUNTER — Ambulatory Visit (HOSPITAL_COMMUNITY)
Admission: EM | Admit: 2024-01-31 | Discharge: 2024-01-31 | Disposition: A | Discharge status: Home / Self Care | Attending: Family Medicine | Admitting: Family Medicine

## 2024-01-31 DIAGNOSIS — M25561 Pain in right knee: Secondary | ICD-10-CM

## 2024-01-31 HISTORY — DX: Gastro-esophageal reflux disease without esophagitis: K21.9

## 2024-01-31 MED ORDER — IBUPROFEN 800 MG PO TABS: 800.0000 mg | ORAL_TABLET | Freq: Three times a day (TID) | ORAL | 0 refills | Status: AC
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
## 2024-01-31 NOTE — Discharge Instructions (Signed)
 Findings from your knee x-ray: MPRESSION: Questionable nondisplaced patellar fracture versus vascular artifact. Recommend CT for further assessment if clinically warranted. Mild suprapatellar fluid collection. 

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
